# Patient Record
Sex: Female | Born: 1985 | Race: Black or African American | Hispanic: No | Marital: Single | Smoking: Current every day smoker
Health system: Southern US, Community
[De-identification: ages and names within clinical notes are randomized; demographics above are authoritative.]

## PROBLEM LIST (undated history)

## (undated) DIAGNOSIS — IMO0001 Reserved for inherently not codable concepts without codable children: Secondary | ICD-10-CM

## (undated) HISTORY — PX: NO PAST SURGERIES: SHX2092

---

## 2005-12-26 ENCOUNTER — Emergency Department (HOSPITAL_COMMUNITY): Admission: EM | Admit: 2005-12-26 | Discharge: 2005-12-27 | Payer: Self-pay | Admitting: Emergency Medicine

## 2014-05-21 ENCOUNTER — Other Ambulatory Visit (HOSPITAL_COMMUNITY)
Admission: RE | Admit: 2014-05-21 | Discharge: 2014-05-21 | Disposition: A | Discharge status: Home / Self Care | Payer: Self-pay | Source: Ambulatory Visit | Attending: Emergency Medicine | Admitting: Emergency Medicine

## 2014-05-21 ENCOUNTER — Encounter (HOSPITAL_COMMUNITY): Payer: Self-pay | Admitting: Emergency Medicine

## 2014-05-21 ENCOUNTER — Emergency Department (INDEPENDENT_AMBULATORY_CARE_PROVIDER_SITE_OTHER)
Admission: EM | Admit: 2014-05-21 | Discharge: 2014-05-21 | Disposition: A | Discharge status: Home / Self Care | Payer: Self-pay | Source: Home / Self Care | Attending: Emergency Medicine | Admitting: Emergency Medicine

## 2014-05-21 DIAGNOSIS — Z113 Encounter for screening for infections with a predominantly sexual mode of transmission: Secondary | ICD-10-CM | POA: Insufficient documentation

## 2014-05-21 DIAGNOSIS — N760 Acute vaginitis: Secondary | ICD-10-CM | POA: Insufficient documentation

## 2014-05-21 DIAGNOSIS — N939 Abnormal uterine and vaginal bleeding, unspecified: Secondary | ICD-10-CM

## 2014-05-21 DIAGNOSIS — K625 Hemorrhage of anus and rectum: Secondary | ICD-10-CM

## 2014-05-21 LAB — POCT PREGNANCY, URINE: Preg Test, Ur: NEGATIVE

## 2014-05-21 NOTE — ED Provider Notes (Signed)
Medical screening examination/treatment/procedure(s) were performed by non-physician practitioner and as supervising physician I was immediately available for consultation/collaboration.  Timmie Calix, M.D.  Layali Freund C Ronald Londo, MD 05/21/14 2121 

## 2014-05-21 NOTE — Discharge Instructions (Signed)
Follow up with a gynecologist about your vaginal bleeding and a gastroenterologist about your rectal bleeding as soon as you are able.    Rectal Bleeding  Rectal bleeding is when blood comes out of the opening of the butt (anus). Rectal bleeding may show up as bright red blood or really dark poop (stool). The poop may look dark red, maroon, or black. Rectal bleeding is often a sign that something is wrong. This needs to be checked by a doctor.  HOME CARE  Eat a diet high in fiber. This will help keep your poop soft.  Limit activity.  Drink enough fluids to keep your pee (urine) clear or pale yellow.  Take a warm bath to soothe any pain.  Follow up with your doctor as told. GET HELP RIGHT AWAY IF:  You have more bleeding.  You have black or dark red poop.  You throw up (vomit) blood or it looks like coffee grounds.  You have belly (abdominal) pain or tenderness.  You have a fever.  You feel weak, sick to your stomach (nauseous), or you pass out (faint).  You have pain that is so bad you cannot poop (bowel movement). MAKE SURE YOU:  Understand these instructions.  Will watch your condition.  Will get help right away if you are not doing well or get worse. Document Released: 03/21/2011 Document Revised: 11/23/2013 Document Reviewed: 03/21/2011 [facility] Patient Information 2015 [place], [place]. This information is not intended to replace advice given to you by your health care provider. Make sure you discuss any questions you have with your health care provider.  Abnormal Uterine Bleeding Abnormal uterine bleeding means bleeding from the vagina that is not your normal menstrual period. This can be:  Bleeding or spotting between periods.  Bleeding after sex (sexual intercourse).  Bleeding that is heavier or more than normal.  Periods that last longer than usual.  Bleeding after menopause. There are many problems that may cause this. Treatment will depend on the cause  of the bleeding. Any kind of bleeding that is not normal should be reviewed by your doctor.  HOME CARE Watch your condition for any changes. These actions may lessen any discomfort you are having:  Do not use tampons or douches as told by your doctor.  Change your pads often. You should get regular pelvic exams and Pap tests. Keep all appointments for tests as told by your doctor. GET HELP IF:  You are bleeding for more than 1 week.  You feel dizzy at times. GET HELP RIGHT AWAY IF:   You pass out.  You have to change pads every 15 to 30 minutes.  You have belly pain.  You have a fever.  You become sweaty or weak.  You are passing large blood clots from the vagina.  You feel sick to your stomach (nauseous) and throw up (vomit). MAKE SURE YOU:  Understand these instructions.  Will watch your condition.  Will get help right away if you are not doing well or get worse. Document Released: 05/06/2009 Document Revised: 07/14/2013 Document Reviewed: 02/05/2013 [facility] Patient Information 2015 [place], [place]. This information is not intended to replace advice given to you by your health care provider. Make sure you discuss any questions you have with your health care provider.

## 2014-05-21 NOTE — ED Provider Notes (Signed)
CSN: [id number]     Arrival date & time 05/21/14  1842 History   First MD Initiated Contact with Patient 05/21/14 2032     Chief Complaint  Patient presents with  . Fever   (Consider location/radiation/quality/duration/timing/severity/associated sxs/prior Treatment) HPI Comments: Pt reports using birth control patch for 6 years. Last used in September, and had period first week in October. Did not restart patch and has been spotting since. Had intercourse once in this time and it resulted in increased bleeding. Pt also reports rectal bleeding with bowel movements, once was a copious amount of red blood about a week ago.   Patient is a 28 y.o. female presenting with vaginal bleeding and hematochezia. The history is provided by the patient. No language interpreter was used.  Vaginal Bleeding Quality:  Spotting Severity:  Mild Onset quality:  Unable to specify Duration:  2 weeks Timing:  Intermittent Progression:  Unchanged Chronicity:  New Menstrual history:  Regular Possible pregnancy: no   Context: after intercourse and spontaneously   Relieved by:  None tried Worsened by:  Intercourse Ineffective treatments:  None tried Associated symptoms: vaginal discharge   Associated symptoms: no abdominal pain, no dyspareunia, no dysuria, no fever and no nausea   Risk factors: unprotected sex   Rectal Bleeding Quality:  Bright red Amount:  Moderate Timing:  Rare Progression:  Unchanged Relieved by:  None tried Worsened by:  Nothing tried Ineffective treatments:  None tried Associated symptoms: no abdominal pain, no fever and no vomiting     History reviewed. No pertinent past medical history. History reviewed. No pertinent past surgical history. Family History  Problem Relation Age of Onset  . Hypertension Father    History  Substance Use Topics  . Smoking status: Never Smoker   . Smokeless tobacco: Not on file  . Alcohol Use: No   OB History   Grav Para Term Preterm  Abortions TAB SAB Ect Mult Living                 Review of Systems  Constitutional: Negative for fever and chills.  Gastrointestinal: Positive for constipation and hematochezia. Negative for nausea, vomiting, abdominal pain and diarrhea.       Rectal bleeding  Genitourinary: Positive for vaginal bleeding and vaginal discharge. Negative for dysuria, hematuria, genital sores, vaginal pain, pelvic pain and dyspareunia.    Allergies  Review of patient's allergies indicates no known allergies.  Home Medications   Prior to Admission medications   Not on File   BP 122/84  Pulse 66  Temp(Src) 98.3 F (36.8 C) (Oral)  Resp 16  SpO2 100%  LMP 04/23/2014 Physical Exam  Constitutional: She appears well-developed and well-nourished. No distress.  Cardiovascular: Normal rate and regular rhythm.   Pulmonary/Chest: Effort normal and breath sounds normal.  Abdominal: Soft. Bowel sounds are normal. She exhibits no distension. There is no tenderness. There is no rigidity, no rebound, no guarding and no CVA tenderness.  Genitourinary: Uterus normal. There is no rash, tenderness or lesion on the right labia. There is no rash, tenderness or lesion on the left labia. Cervix exhibits friability. Cervix exhibits no motion tenderness and no discharge. Right adnexum displays no mass and no tenderness. Left adnexum displays no mass and no tenderness. Vaginal discharge found.  No active bleeding. Scant old blood in vagina.   Lymphadenopathy:       Right: No inguinal adenopathy present.       Left: No inguinal adenopathy present.    ED  Course  Procedures (including critical care time) Labs Review Labs Reviewed  POC OCCULT BLOOD, ED  POCT PREGNANCY, URINE  CERVICOVAGINAL ANCILLARY ONLY    Imaging Review No results found.   MDM   1. Vaginal bleeding   2. Rectal bleeding   Urine preg negative. Hemoccult positive. UA shows blood (likely contaminant from vagina) and trace leuk.   Referred to  [facility] for further eval.      Cathlyn ParsonsAngela M Ason Heslin, NP 05/21/14 2117

## 2014-05-21 NOTE — ED Notes (Signed)
C/o fever 99.9 last night.  C/o vaginal bleeding onset 2nd week of October and rectal bleeding around the same time.  Rectal bleeding only when she has a BM.  C/o cramping in her abdomen today and 2 days last week.  States she spots but when she has intercourse the bleeding is like a period.  Had vaginal discharge the 2nd week in Oct.  LMP 10/2 and ended 10/6.  Bleeding started back 10/12 and cont.

## 2014-05-24 LAB — POCT URINALYSIS DIP (DEVICE)
BILIRUBIN URINE: NEGATIVE
Glucose, UA: NEGATIVE mg/dL
Nitrite: NEGATIVE
PH: 6.5 (ref 5.0–8.0)
PROTEIN: NEGATIVE mg/dL
SPECIFIC GRAVITY, URINE: 1.02 (ref 1.005–1.030)
Urobilinogen, UA: 0.2 mg/dL (ref 0.0–1.0)

## 2014-05-24 LAB — OCCULT BLOOD, POC DEVICE: Fecal Occult Bld: POSITIVE — AB

## 2014-05-24 LAB — CERVICOVAGINAL ANCILLARY ONLY
Chlamydia: POSITIVE — AB
Neisseria Gonorrhea: NEGATIVE

## 2014-05-25 ENCOUNTER — Telehealth (HOSPITAL_COMMUNITY): Payer: Self-pay | Admitting: Emergency Medicine

## 2014-05-25 ENCOUNTER — Telehealth (HOSPITAL_COMMUNITY): Payer: Self-pay | Admitting: *Deleted

## 2014-05-25 LAB — CERVICOVAGINAL ANCILLARY ONLY
WET PREP (BD AFFIRM): NEGATIVE
Wet Prep (BD Affirm): NEGATIVE
Wet Prep (BD Affirm): NEGATIVE

## 2014-05-25 MED ORDER — AZITHROMYCIN 500 MG PO TABS: 1000.0000 mg | ORAL_TABLET | Freq: Every day | ORAL | Status: DC

## 2014-05-25 NOTE — ED Notes (Signed)
DNA probe was positive for chlamydia.  Will treat with azithromycin 1 gm PO, and need to inform patient and HD. She does not have a pharmacy listed, so we will need to call her, find out her pharmacy, then call the prescription in to her pharmacy.  Reuben Likesavid C Delmo Matty, MD 05/25/14 [phone]

## 2014-05-25 NOTE — Telephone Encounter (Signed)
-----   Message from Vassie MoselleSuzanne M York, RN sent at 05/24/2014  9:25 PM EST ----- Regarding: lab Chlamydia pos. Pt. of Rica MastAngela Kabbe NP and you co-signed. Needs treatment. Vassie MoselleYork, Suzanne M 05/24/2014

## 2014-05-26 NOTE — ED Notes (Signed)
11/3 I called pt. Pt. verified x 2 and given results.  Pt. told she needs Zithromax for Chlamydia.  [facility] [address]  Pt. instructed to notify her partner, no sex for 1 week and to practice safe sex. Pt. told she can get HIV testing at [facility], by appointment.  Rx. called to pharmacist. DHHS [facility]. Vassie MoselleYork, Gennavieve Huq M 05/26/2014

## 2014-08-15 ENCOUNTER — Emergency Department (HOSPITAL_COMMUNITY)
Admission: EM | Admit: 2014-08-15 | Discharge: 2014-08-15 | Disposition: A | Discharge status: Home / Self Care | Payer: Self-pay | Attending: Emergency Medicine | Admitting: Emergency Medicine

## 2014-08-15 ENCOUNTER — Encounter (HOSPITAL_COMMUNITY): Payer: Self-pay | Admitting: *Deleted

## 2014-08-15 ENCOUNTER — Emergency Department (HOSPITAL_COMMUNITY): Payer: Self-pay

## 2014-08-15 DIAGNOSIS — S61452A Open bite of left hand, initial encounter: Secondary | ICD-10-CM | POA: Insufficient documentation

## 2014-08-15 DIAGNOSIS — Y9289 Other specified places as the place of occurrence of the external cause: Secondary | ICD-10-CM | POA: Insufficient documentation

## 2014-08-15 DIAGNOSIS — S0012XA Contusion of left eyelid and periocular area, initial encounter: Secondary | ICD-10-CM | POA: Insufficient documentation

## 2014-08-15 DIAGNOSIS — Y998 Other external cause status: Secondary | ICD-10-CM | POA: Insufficient documentation

## 2014-08-15 DIAGNOSIS — W503XXA Accidental bite by another person, initial encounter: Secondary | ICD-10-CM

## 2014-08-15 DIAGNOSIS — Z792 Long term (current) use of antibiotics: Secondary | ICD-10-CM | POA: Insufficient documentation

## 2014-08-15 DIAGNOSIS — Y9389 Activity, other specified: Secondary | ICD-10-CM | POA: Insufficient documentation

## 2014-08-15 DIAGNOSIS — S61451A Open bite of right hand, initial encounter: Secondary | ICD-10-CM | POA: Insufficient documentation

## 2014-08-15 MED ORDER — HYDROCODONE-ACETAMINOPHEN 5-325 MG PO TABS: 1.0000 | ORAL_TABLET | ORAL | Status: DC | PRN

## 2014-08-15 MED ORDER — AMOXICILLIN-POT CLAVULANATE 875-125 MG PO TABS
1.0000 | ORAL_TABLET | Freq: Once | ORAL | Status: AC
  Administered 2014-08-15: 1 via ORAL
  Filled 2014-08-15: qty 1

## 2014-08-15 MED ORDER — AMOXICILLIN-POT CLAVULANATE 875-125 MG PO TABS: 1.0000 | ORAL_TABLET | Freq: Two times a day (BID) | ORAL | Status: DC
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
## 2014-08-15 NOTE — ED Notes (Signed)
Pt left fast trac before vitals were done. Pt was seen by Child psychotherapistocial worker .

## 2014-08-15 NOTE — ED Notes (Signed)
Declined W/C at D/C and was escorted to lobby by RN. 
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
## 2014-09-24 ENCOUNTER — Other Ambulatory Visit (HOSPITAL_COMMUNITY)
Admission: RE | Admit: 2014-09-24 | Discharge: 2014-09-24 | Disposition: A | Discharge status: Home / Self Care | Payer: Self-pay | Source: Ambulatory Visit | Attending: Obstetrics and Gynecology | Admitting: Obstetrics and Gynecology

## 2014-09-24 ENCOUNTER — Other Ambulatory Visit: Payer: Self-pay | Admitting: Obstetrics and Gynecology

## 2014-09-24 DIAGNOSIS — Z01419 Encounter for gynecological examination (general) (routine) without abnormal findings: Secondary | ICD-10-CM | POA: Insufficient documentation

## 2014-09-27 LAB — CYTOLOGY - PAP

## 2015-01-05 ENCOUNTER — Ambulatory Visit (INDEPENDENT_AMBULATORY_CARE_PROVIDER_SITE_OTHER): Payer: 59 | Admitting: Physician Assistant

## 2015-01-05 VITALS — BP 102/70 | HR 61 | Temp 98.3°F | Resp 18 | Ht 62.0 in | Wt 116.5 lb

## 2015-01-05 DIAGNOSIS — A499 Bacterial infection, unspecified: Secondary | ICD-10-CM

## 2015-01-05 DIAGNOSIS — Z113 Encounter for screening for infections with a predominantly sexual mode of transmission: Secondary | ICD-10-CM

## 2015-01-05 DIAGNOSIS — L298 Other pruritus: Secondary | ICD-10-CM

## 2015-01-05 DIAGNOSIS — N760 Acute vaginitis: Secondary | ICD-10-CM | POA: Diagnosis not present

## 2015-01-05 DIAGNOSIS — B9689 Other specified bacterial agents as the cause of diseases classified elsewhere: Secondary | ICD-10-CM

## 2015-01-05 DIAGNOSIS — N898 Other specified noninflammatory disorders of vagina: Secondary | ICD-10-CM

## 2015-01-05 LAB — POCT UA - MICROSCOPIC ONLY
CASTS, UR, LPF, POC: NEGATIVE
Crystals, Ur, HPF, POC: NEGATIVE
Yeast, UA: NEGATIVE

## 2015-01-05 LAB — POCT URINALYSIS DIPSTICK
Bilirubin, UA: NEGATIVE
Glucose, UA: NEGATIVE
NITRITE UA: NEGATIVE
PH UA: 5.5
RBC UA: NEGATIVE
Spec Grav, UA: >=1.03
UROBILINOGEN UA: 0.2

## 2015-01-05 LAB — POCT WET PREP WITH KOH
KOH Prep POC: NEGATIVE
TRICHOMONAS UA: NEGATIVE
YEAST WET PREP PER HPF POC: NEGATIVE

## 2015-01-05 MED ORDER — METRONIDAZOLE 500 MG PO TABS: 500.0000 mg | ORAL_TABLET | Freq: Two times a day (BID) | ORAL | Status: DC

## 2015-01-05 NOTE — Patient Instructions (Addendum)
Please take to completion.  I will be in touch with you within the next 3 days, of your lab results, and we will go from there.    Bacterial Vaginosis Bacterial vaginosis is an infection of the vagina. It happens when too many of certain germs (bacteria) grow in the vagina. HOME CARE  Take your medicine as told by your doctor.  Finish your medicine even if you start to feel better.  Do not have sex until you finish your medicine and are better.  Tell your sex partner that you have an infection. They should see their doctor for treatment.  Practice safe sex. Use condoms. Have only one sex partner. GET HELP IF:  You are not getting better after 3 days of treatment.  You have more grey fluid (discharge) coming from your vagina than before.  You have more pain than before.  You have a fever. MAKE SURE YOU:   Understand these instructions.  Will watch your condition.  Will get help right away if you are not doing well or get worse. Document Released: 04/17/2008 Document Revised: 04/29/2013 Document Reviewed: 02/18/2013 [facility] Patient Information 2015 [place], [place]. This information is not intended to replace advice given to you by your health care provider. Make sure you discuss any questions you have with your health care provider.

## 2015-01-05 NOTE — Progress Notes (Signed)
[facility] [address] [place] [address] [phone]  Date:  01/05/2015   Name:  Yolanda Morrison   DOB:  04-16-1986   MRN:  [id number]  PCP:  No PCP Per Patient    History of Present Illness:  Yolanda Morrison is a 29 y.o. female patient who presents to [facility] with chief complaint of vaginal itching for several days.  She has no abnormal vaginal discharge, odor, or rash. Patient denies dysuria, frequency, or hematuria. She has no abdominal pain. Patient is concerned because the person that she was sexually active with seemed "sketchy", though there is no history of STD.  Patient is requesting STD screening, but she would not like an HIV test today, stating that she is "not ready for that".  There are no active problems to display for this patient.   No past medical history on file.  No past surgical history on file.  History  Substance Use Topics  . Smoking status: Never Smoker   . Smokeless tobacco: Not on file  . Alcohol Use: No    Family History  Problem Relation Age of Onset  . Hypertension Father   . Hyperlipidemia Father   . Cancer Maternal Grandmother     No Known Allergies  Medication list has been reviewed and updated.  No current outpatient prescriptions on file prior to visit.   No current facility-administered medications on file prior to visit.    ROS ROS otherwise unremarkable unless listed above.  Physical Examination: BP 102/70 mmHg  Pulse 61  Temp(Src) 98.3 F (36.8 C) (Oral)  Resp 18  Ht 5\' 2"  (1.575 m)  Wt 116 lb 8 oz (52.844 kg)  BMI 21.30 kg/m2  SpO2 98%  LMP 12/22/2014 (Approximate) Ideal Body Weight: Weight in (lb) to have BMI = 25: 136.4  Physical Exam  Constitutional: She is oriented to person, place, and time. She appears well-developed and well-nourished. No distress.  HENT:  Head: Normocephalic and atraumatic.  Cardiovascular: Normal rate.   Pulmonary/Chest: Effort normal. No respiratory distress.   Abdominal: Soft. Bowel sounds are normal. She exhibits no distension. There is no tenderness.  Genitourinary: Pelvic exam was performed with patient supine. There is no rash or tenderness on the right labia. There is no rash or tenderness on the left labia. Cervix exhibits discharge (Whitish yellow.  Mildly erythematous patching along the cervix without friability.  There is some bubbling within the discharge, though not frothy.). Cervix exhibits no motion tenderness and no friability. Vaginal discharge (whitish yellow) found.  Neurological: She is alert and oriented to person, place, and time.  Skin: Skin is warm and dry.   Results for orders placed or performed in visit on 01/05/15  POCT UA - Microscopic Only  Result Value Ref Range   WBC, Ur, HPF, POC 15-20    RBC, urine, microscopic 1-3    Bacteria, U Microscopic 2+    Mucus, UA 1+    Epithelial cells, urine per micros 8-12    Crystals, Ur, HPF, POC neg    Casts, Ur, LPF, POC neg    Yeast, UA neg   POCT urinalysis dipstick  Result Value Ref Range   Color, UA amber    Clarity, UA cloudy    Glucose, UA neg    Bilirubin, UA neg    Ketones, UA trace    Spec Grav, UA >=1.030    Blood, UA neg    pH, UA 5.5    Protein, UA trace  Urobilinogen, UA 0.2    Nitrite, UA neg    Leukocytes, UA small (1+) (A) Negative  POCT Wet Prep with KOH  Result Value Ref Range   Trichomonas, UA Negative    Clue Cells Wet Prep HPF POC 2-3    Epithelial Wet Prep HPF POC Few Few, Moderate, Many   Yeast Wet Prep HPF POC neg    Bacteria Wet Prep HPF POC Moderate (A) Few   RBC Wet Prep HPF POC 0-2    WBC Wet Prep HPF POC 8-12    KOH Prep POC Negative      Assessment and Plan: 29 year old female is here today with chief complaint of vaginal itching. I am treating her for arterial vaginosis at this time with metronidazole twice a day for 7 days. I have advised her to await contact. I am concerned of the bacteria leukocyte found on UA and wet prep,  but without more symptoms or direct STD contact, I will hold off on providing empiric treatment at this time. I have advised her to avoid sexual contact at this time pending results.  I advised her to have an HIV test, but she declines at this time.  Bacterial vaginosis  Vaginal itching - Plan: POCT UA - Microscopic Only, POCT urinalysis dipstick, RPR, GC/Chlamydia Probe Amp, POCT Wet Prep with KOH, metroNIDAZOLE (FLAGYL) 500 MG tablet  Screening for STD (sexually transmitted disease)  Trena Platt, PA-C [facility] 6/15/20169:10 PM

## 2015-01-06 LAB — RPR

## 2015-01-07 LAB — GC/CHLAMYDIA PROBE AMP
CT Probe RNA: NEGATIVE
GC Probe RNA: NEGATIVE

## 2015-02-12 ENCOUNTER — Encounter (HOSPITAL_BASED_OUTPATIENT_CLINIC_OR_DEPARTMENT_OTHER): Payer: Self-pay | Admitting: Emergency Medicine

## 2015-02-12 ENCOUNTER — Emergency Department (HOSPITAL_BASED_OUTPATIENT_CLINIC_OR_DEPARTMENT_OTHER)
Admission: EM | Admit: 2015-02-12 | Discharge: 2015-02-12 | Disposition: A | Discharge status: Home / Self Care | Payer: Worker's Compensation | Attending: Emergency Medicine | Admitting: Emergency Medicine

## 2015-02-12 ENCOUNTER — Emergency Department (HOSPITAL_BASED_OUTPATIENT_CLINIC_OR_DEPARTMENT_OTHER): Payer: Worker's Compensation

## 2015-02-12 DIAGNOSIS — Y990 Civilian activity done for income or pay: Secondary | ICD-10-CM | POA: Insufficient documentation

## 2015-02-12 DIAGNOSIS — Y9389 Activity, other specified: Secondary | ICD-10-CM | POA: Insufficient documentation

## 2015-02-12 DIAGNOSIS — IMO0001 Reserved for inherently not codable concepts without codable children: Secondary | ICD-10-CM

## 2015-02-12 DIAGNOSIS — S68129A Partial traumatic metacarpophalangeal amputation of unspecified finger, initial encounter: Secondary | ICD-10-CM

## 2015-02-12 DIAGNOSIS — Y9289 Other specified places as the place of occurrence of the external cause: Secondary | ICD-10-CM | POA: Insufficient documentation

## 2015-02-12 DIAGNOSIS — W230XXA Caught, crushed, jammed, or pinched between moving objects, initial encounter: Secondary | ICD-10-CM | POA: Insufficient documentation

## 2015-02-12 DIAGNOSIS — S62633A Displaced fracture of distal phalanx of left middle finger, initial encounter for closed fracture: Secondary | ICD-10-CM | POA: Insufficient documentation

## 2015-02-12 MED ORDER — HYDROCODONE-ACETAMINOPHEN 5-325 MG PO TABS: 2.0000 | ORAL_TABLET | ORAL | Status: DC | PRN

## 2015-02-12 MED ORDER — HYDROCODONE-ACETAMINOPHEN 5-325 MG PO TABS
2.0000 | ORAL_TABLET | Freq: Once | ORAL | Status: AC
  Administered 2015-02-12: 2 via ORAL
  Filled 2015-02-12: qty 2

## 2015-02-12 MED ORDER — BACITRACIN 500 UNIT/GM EX OINT
1.0000 "application " | TOPICAL_OINTMENT | Freq: Two times a day (BID) | CUTANEOUS | Status: DC
  Administered 2015-02-12: 1 via TOPICAL
  Filled 2015-02-12: qty 0.9

## 2015-02-12 MED ORDER — NAPROXEN 500 MG PO TABS: 500.0000 mg | ORAL_TABLET | Freq: Two times a day (BID) | ORAL | Status: DC

## 2015-02-12 MED ORDER — LIDOCAINE HCL (PF) 1 % IJ SOLN
INTRAMUSCULAR | Status: AC
  Administered 2015-02-12: 17:00:00
  Filled 2015-02-12: qty 5

## 2015-02-12 MED ORDER — LIDOCAINE HCL (PF) 1 % IJ SOLN
10.0000 mL | Freq: Once | INTRAMUSCULAR | Status: AC
  Administered 2015-02-12: 10 mL
  Filled 2015-02-12: qty 10

## 2015-02-12 MED ORDER — CEPHALEXIN 250 MG PO CAPS
500.0000 mg | ORAL_CAPSULE | Freq: Once | ORAL | Status: AC
  Administered 2015-02-12: 500 mg via ORAL
  Filled 2015-02-12: qty 2

## 2015-02-12 MED ORDER — CEPHALEXIN 500 MG PO CAPS: 500.0000 mg | ORAL_CAPSULE | Freq: Two times a day (BID) | ORAL | Status: DC

## 2015-02-12 NOTE — ED Notes (Signed)
Moved patient after providing discharge papers and dressing wound while she is waiting to speak with the MD

## 2015-02-12 NOTE — Discharge Instructions (Signed)

## 2015-02-12 NOTE — ED Notes (Signed)
Pt snagged 3rd left finger tip on a metal pole at work, tearing skin and exposing underlying tissue of finger pad. Yolanda Morrison

## 2015-02-12 NOTE — ED Provider Notes (Addendum)
CSN: [id number]     Arrival date & time 02/12/15  1603 History   This chart was scribed for Eber Hong, MD by Abel Presto, ED Scribe. This patient was seen in room MH05/MH05 and the patient's care was started at 4:12 PM.   Chief Complaint  Patient presents with  . Finger Injury     The history is provided by the patient. No language interpreter was used.   HPI Comments: Yolanda Morrison is a 29 y.o. female who presents to the Emergency Department complaining of laceration to distal pad of left middle finger with onset just PTA.  Pt was at work using a machine which had a pole that became caught on a conveyer belt pulling her finger and tearing the skin. Bleeding is controlled. Pt notes associated pain. Pt is not on blood thinners. She denies any other injury and numbness.    History reviewed. No pertinent past medical history. History reviewed. No pertinent past surgical history. Family History  Problem Relation Age of Onset  . Hypertension Father   . Hyperlipidemia Father   . Cancer Maternal Grandmother    History  Substance Use Topics  . Smoking status: Never Smoker   . Smokeless tobacco: Not on file  . Alcohol Use: No   OB History    No data available     Review of Systems  Skin: Positive for wound.  Neurological: Negative for numbness.      Allergies  Review of patient's allergies indicates no known allergies.  Home Medications   Prior to Admission medications   Medication Sig Start Date End Date Taking? Authorizing Provider  cephALEXin (KEFLEX) 500 MG capsule Take 1 capsule (500 mg total) by mouth 2 (two) times daily. 02/12/15   Eber Hong, MD  HYDROcodone-acetaminophen (NORCO/VICODIN) 5-325 MG per tablet Take 2 tablets by mouth every 4 (four) hours as needed. 02/12/15   Eber Hong, MD  metroNIDAZOLE (FLAGYL) 500 MG tablet Take 1 tablet (500 mg total) by mouth 2 (two) times daily with a meal. DO NOT CONSUME ALCOHOL WHILE TAKING THIS MEDICATION. 01/05/15    Collie Siad English, PA  naproxen (NAPROSYN) 500 MG tablet Take 1 tablet (500 mg total) by mouth 2 (two) times daily with a meal. 02/12/15   Eber Hong, MD   LMP 01/21/2015 (Approximate) Physical Exam  Constitutional: She appears well-developed and well-nourished.  HENT:  Head: Normocephalic and atraumatic.  Eyes: Conjunctivae are normal. Right eye exhibits no discharge. Left eye exhibits no discharge.  Pulmonary/Chest: Effort normal. No respiratory distress.  Musculoskeletal:  Left middle finger distal pad injury  Nail bed intact  Neurological: She is alert. Coordination normal.  Skin: Skin is warm and dry. No rash noted. She is not diaphoretic. No erythema.  Psychiatric: She has a normal mood and affect.  Nursing note and vitals reviewed.   ED Course  NERVE BLOCK Date/Time: 02/12/2015 5:00 PM Performed by: Eber Hong Authorized by: Eber Hong Consent: Verbal consent obtained. Risks and benefits: risks, benefits and alternatives were discussed Consent given by: patient Patient understanding: patient states understanding of the procedure being performed Required items: required blood products, implants, devices, and special equipment available Patient identity confirmed: verbally with patient Indications: debridement Body area: upper extremity Nerve: digital Laterality: left Patient sedated: no Preparation: Patient was prepped and draped in the usual sterile fashion. Patient position: sitting Needle gauge: 27 G Location technique: anatomical landmarks Local anesthetic: lidocaine 1% without epinephrine Anesthetic total: 4 ml Outcome: pain improved Patient tolerance: Patient tolerated  the procedure well with no immediate complications   (including critical care time) DIAGNOSTIC STUDIES: Oxygen Saturation is 100% on Room air, Normal by my interpretation.    COORDINATION OF CARE: 4:17 PM Discussed treatment plan with patient at beside, the patient agrees with the  plan and has no further questions at this time.   Labs Review Labs Reviewed - No data to display  Imaging Review Dg Finger Middle Left  02/12/2015   CLINICAL DATA:  Initial encounter: Patient injured after hitting metal pole  EXAM: LEFT THIRD FINGER 2+V  COMPARISON:  None.  FINDINGS: Frontal, oblique, and lateral views were obtained. There is extensive soft tissue injury to the distal aspect of the third digit. There is a comminuted fracture of the distal aspect of the third distal phalanx with several displaced fracture fragments. No other fractures. No dislocation. Joint spaces appear intact.  IMPRESSION: Comminuted fracture distal aspect third distal phalanx with soft tissue injury distally. No dislocation.   Electronically Signed   By: Bretta Bang III M.D.   On: 02/12/2015 16:35      MDM   Final diagnoses:  Finger near amputation, left, initial encounter    The patient has significant tissue destruction of the left middle finger at the tip. I have personally reviewed the x-rays and find her to be a comminuted distal tuft fracture, there is significant soft tissue deficit overlying this. Digital block was performed and the patient was informed of her results. We'll discuss with the hand surgeon regarding follow-up care though at this time there does not appear to be in adequate piece of tissue to close over this injury.  Hand surgery paged  I personally performed the services described in this documentation, which was scribed in my presence. The recorded information has been reviewed and is accurate.          Discussed care with hand surgeon, Dr. Izora Ribas, he will see the patient in follow-up on Monday, sterile dressing given. Antibiotics given.   Meds given in ED:  Medications  bacitracin ointment 1 application (not administered)  cephALEXin (KEFLEX) capsule 500 mg (not administered)  HYDROcodone-acetaminophen (NORCO/VICODIN) 5-325 MG per tablet 2 tablet (not  administered)  lidocaine (PF) (XYLOCAINE) 1 % injection 10 mL (10 mLs Infiltration Given 02/12/15 1637)  lidocaine (PF) (XYLOCAINE) 1 % injection (  Given 02/12/15 1637)    New Prescriptions   CEPHALEXIN (KEFLEX) 500 MG CAPSULE    Take 1 capsule (500 mg total) by mouth 2 (two) times daily.   HYDROCODONE-ACETAMINOPHEN (NORCO/VICODIN) 5-325 MG PER TABLET    Take 2 tablets by mouth every 4 (four) hours as needed.   NAPROXEN (NAPROSYN) 500 MG TABLET    Take 1 tablet (500 mg total) by mouth 2 (two) times daily with a meal.      Eber Hong, MD 02/12/15 1757  Eber Hong, MD 02/24/15 [phone]

## 2015-03-08 ENCOUNTER — Ambulatory Visit (INDEPENDENT_AMBULATORY_CARE_PROVIDER_SITE_OTHER): Payer: 59 | Admitting: Internal Medicine

## 2015-03-08 VITALS — BP 110/86 | HR 63 | Temp 98.2°F | Resp 16 | Ht 62.5 in | Wt 121.8 lb

## 2015-03-08 DIAGNOSIS — N760 Acute vaginitis: Secondary | ICD-10-CM

## 2015-03-08 DIAGNOSIS — H6122 Impacted cerumen, left ear: Secondary | ICD-10-CM | POA: Diagnosis not present

## 2015-03-08 LAB — POCT WET PREP WITH KOH
KOH Prep POC: POSITIVE
Trichomonas, UA: NEGATIVE
Yeast Wet Prep HPF POC: POSITIVE

## 2015-03-08 MED ORDER — FLUCONAZOLE 150 MG PO TABS: 150.0000 mg | ORAL_TABLET | Freq: Once | ORAL | Status: DC

## 2015-03-08 NOTE — Patient Instructions (Signed)
Ear irrigation kit at pharmacy(will have a hydrogen peroxide solution--or cerumenex) Look for "Debrox" Return 1 week if not completely clear 

## 2015-03-08 NOTE — Progress Notes (Signed)
   Subjective:  This chart was scribed for [name], MD by [facility], medical scribe at [facility].The patient was seen in exam room ** and the patient's care was started at 4:17 PM.   Patient ID: Yolanda Morrison, female    DOB: [birth year], 29 y.o.   MRN: [id number] Chief Complaint  Patient presents with  . Cerumen Impaction    x2 weeks   . Vaginal Discharge    x1 week    HPI HPI Comments: Yolanda Morrison is a 29 y.o. female who presents to [facility] complaining of a clumpy, thick vaginal discharge with associated tenderness and itching for the past week. Seen here on 01/05/2015 for a similar issue, and treated with flagyl which did provide relief.  No new sexual partners.   She also complains of bilateral cerumen impaction left worse than right for the past two weeks. No fever or chills. decr hearing on L.  Review of Systems  Constitutional: Negative for fever and chills.  Genitourinary: Positive for vaginal discharge and vaginal pain.      Objective:  BP 110/86 mmHg  Pulse 63  Temp(Src) 98.2 F (36.8 C) (Oral)  Resp 16  Ht 5' 2.5" (1.588 m)  Wt 121 lb 12.8 oz (55.248 kg)  BMI 21.91 kg/m2  SpO2 97%  LMP 02/21/2015 Physical Exam  Constitutional: She is oriented to person, place, and time. She appears well-developed and well-nourished. No distress.  HENT:  Head: Normocephalic and atraumatic.  Minimal wax on right Left auditory canal is completely occluded.  Eyes: Pupils are equal, round, and reactive to light.  Neck: Normal range of motion.  Cardiovascular: Normal rate.   Pulmonary/Chest: Effort normal. No respiratory distress.  Genitourinary:  Introitus w/ red sl swollen labia with thick white d/c No ulcers  KOH positive  Musculoskeletal: Normal range of motion.  Neurological: She is alert and oriented to person, place, and time.  Skin: Skin is warm and dry.  Psychiatric: She has a normal mood and affect. Her  behavior is normal.  Nursing note and vitals reviewed.    procedure: The left anterior is irrigated---the L ear that is---twice after colace without success So curetted til Tm exposed and re irrigated with longer line and most wax removed Assessment & Plan:  Vaginitis and vulvovaginitis - Plan: POCT Wet Prep with KOH  Cerumen impaction, left  Meds ordered this encounter  Medications  . fluconazole (DIFLUCAN) 150 MG tablet    Sig: Take 1 tablet (150 mg total) by mouth once.    Dispense:  1 tablet    Refill:  2   F/u if vag d/c recurs F/u if wax reimpacts   I have completed the patient encounter in its entirety as documented by the scribe, with editing by me where necessary. Robert P. Merla Riches, M.D.

## 2015-04-15 ENCOUNTER — Telehealth: Payer: Self-pay

## 2015-04-15 DIAGNOSIS — N760 Acute vaginitis: Secondary | ICD-10-CM

## 2015-04-15 NOTE — Telephone Encounter (Signed)
Patient would like a referral to [facility]. Patient phone! [phone]

## 2015-04-15 NOTE — Telephone Encounter (Signed)
Spoke with pt, advised referral placed. 

## 2015-04-15 NOTE — Telephone Encounter (Signed)
Referral placed.

## 2015-04-15 NOTE — Telephone Encounter (Signed)
Can we refer?  She has discomfort in her vaginal area but has no symptoms. Her clitoris is very sensitive to touch. She doesn't have any itching, burning, or discharge.

## 2015-04-16 ENCOUNTER — Ambulatory Visit (INDEPENDENT_AMBULATORY_CARE_PROVIDER_SITE_OTHER): Payer: 59 | Admitting: Physician Assistant

## 2015-04-16 VITALS — BP 112/78 | HR 71 | Temp 98.2°F | Resp 16 | Ht 62.5 in | Wt 123.0 lb

## 2015-04-16 DIAGNOSIS — N898 Other specified noninflammatory disorders of vagina: Secondary | ICD-10-CM | POA: Diagnosis not present

## 2015-04-16 DIAGNOSIS — B379 Candidiasis, unspecified: Secondary | ICD-10-CM | POA: Diagnosis not present

## 2015-04-16 LAB — POCT WET + KOH PREP
Trich by wet prep: ABSENT
YEAST BY KOH: ABSENT
YEAST BY WET PREP: ABSENT

## 2015-04-16 MED ORDER — NYSTATIN 100000 UNIT/GM EX CREA: 1.0000 "application " | TOPICAL_CREAM | Freq: Two times a day (BID) | CUTANEOUS | Status: AC

## 2015-04-16 MED ORDER — FLUCONAZOLE 150 MG PO TABS: 150.0000 mg | ORAL_TABLET | Freq: Once | ORAL | Status: DC

## 2015-04-16 NOTE — Patient Instructions (Signed)
Please take the medication once.  Repeat take the second pill after 1 week, if you are still having symptoms.  I will contact you in regards to the results. Keep that area clean twice per day.  Apply the cream as prescribed.    Monilial Vaginitis Vaginitis in a soreness, swelling and redness (inflammation) of the vagina and vulva. Monilial vaginitis is not a sexually transmitted infection. CAUSES  Yeast vaginitis is caused by yeast (candida) that is normally found in your vagina. With a yeast infection, the candida has overgrown in number to a point that upsets the chemical balance. SYMPTOMS   White, thick vaginal discharge.  Swelling, itching, redness and irritation of the vagina and possibly the lips of the vagina (vulva).  Burning or painful urination.  Painful intercourse. DIAGNOSIS  Things that may contribute to monilial vaginitis are:  Postmenopausal and virginal states.  Pregnancy.  Infections.  Being tired, sick or stressed, especially if you had monilial vaginitis in the past.  Diabetes. Good control will help lower the chance.  Birth control pills.  Tight fitting garments.  Using bubble bath, feminine sprays, douches or deodorant tampons.  Taking certain medications that kill germs (antibiotics).  Sporadic recurrence can occur if you become ill. TREATMENT  Your caregiver will give you medication.  There are several kinds of anti monilial vaginal creams and suppositories specific for monilial vaginitis. For recurrent yeast infections, use a suppository or cream in the vagina 2 times a week, or as directed.  Anti-monilial or steroid cream for the itching or irritation of the vulva may also be used. Get your caregiver's permission.  Painting the vagina with methylene blue solution may help if the monilial cream does not work.  Eating yogurt may help prevent monilial vaginitis. HOME CARE INSTRUCTIONS   Finish all medication as prescribed.  Do not have sex  until treatment is completed or after your caregiver tells you it is okay.  Take warm sitz baths.  Do not douche.  Do not use tampons, especially scented ones.  Wear cotton underwear.  Avoid tight pants and panty hose.  Tell your sexual partner that you have a yeast infection. They should go to their caregiver if they have symptoms such as mild rash or itching.  Your sexual partner should be treated as well if your infection is difficult to eliminate.  Practice safer sex. Use condoms.  Some vaginal medications cause latex condoms to fail. Vaginal medications that harm condoms are:  Cleocin cream.  Butoconazole (Femstat).  Terconazole (Terazol) vaginal suppository.  Miconazole (Monistat) (may be purchased over the counter). SEEK MEDICAL CARE IF:   You have a temperature by mouth above 102 F (38.9 C).  The infection is getting worse after 2 days of treatment.  The infection is not getting better after 3 days of treatment.  You develop blisters in or around your vagina.  You develop vaginal bleeding, and it is not your menstrual period.  You have pain when you urinate.  You develop intestinal problems.  You have pain with sexual intercourse. Document Released: 04/18/2005 Document Revised: 10/01/2011 Document Reviewed: 12/31/2008 [facility] Patient Information 2015 [place], [place]. This information is not intended to replace advice given to you by your health care provider. Make sure you discuss any questions you have with your health care provider.

## 2015-04-19 LAB — GC/CHLAMYDIA PROBE AMP
CT Probe RNA: NEGATIVE
GC PROBE AMP APTIMA: NEGATIVE

## 2015-04-22 NOTE — Progress Notes (Addendum)
   Subjective:    Patient ID: Yolanda Morrison, female    DOB: Apr 15, 1986, 29 y.o.   MRN: [id number]  HPI  29 year old female is here today for chief complaint of abnormal vaginal pruritus for 2 weeks.    Patient states her discharge is normal and without rash and odor.  No nausea or vomiting.  No frequency, dysuria, or hematuria.   Sexually active unprotected at this time.   Review of Systems ROS otherwise unremarkable unless listed above.     Objective:   Physical Exam  Constitutional: She is oriented to person, place, and time. She appears well-developed and well-nourished. No distress.  HENT:  Head: Normocephalic.  Cardiovascular: Normal rate and regular rhythm.   Pulmonary/Chest: Effort normal and breath sounds normal. No respiratory distress. She has no wheezes.  Genitourinary: There is no rash or tenderness on the right labia. There is no rash or tenderness on the left labia. Vaginal discharge found.  Discharge surrounds the clitoris and labia.   Neurological: She is alert and oriented to person, place, and time.  Skin: Skin is warm and dry. She is not diaphoretic.  Psychiatric: She has a normal mood and affect. Her behavior is normal.   Results for orders placed or performed in visit on 04/16/15  GC/Chlamydia Probe Amp  Result Value Ref Range   CT Probe RNA NEGATIVE    GC Probe RNA NEGATIVE   POCT Wet + KOH Prep ([facility])  Result Value Ref Range   Yeast by KOH Absent Present, Absent   Yeast by wet prep Absent Present, Absent   WBC by wet prep Many (A) None, Few   Clue Cells Wet Prep HPF POC Few (A) None   Trich by wet prep Absent Present, Absent   Bacteria Wet Prep HPF POC Few None, Few   Epithelial Cells By [employer] Pref ([facility]) Moderate (A) None, Few   RBC,UR,HPF,POC Few (A) None RBC/hpf       Assessment & Plan:  29 year old female is here today for chief complaint abnormal vaginal discharge.  -Advised diflucan and nystatin topical cream.   Yeast infection - Plan:  fluconazole (DIFLUCAN) 150 MG tablet, nystatin cream (MYCOSTATIN)  Vaginal discharge - Plan: POCT Wet + KOH Prep ([facility]), GC/Chlamydia Probe Amp  Trena Platt, PA-C [facility] 9/30/20163:34 PM

## 2015-05-03 NOTE — Progress Notes (Signed)
  Medical screening examination/treatment/procedure(s) were performed by non-physician practitioner and as supervising physician I was immediately available for consultation/collaboration.     

## 2015-07-03 ENCOUNTER — Ambulatory Visit (INDEPENDENT_AMBULATORY_CARE_PROVIDER_SITE_OTHER): Payer: 59 | Admitting: Family Medicine

## 2015-07-03 VITALS — BP 108/70 | HR 76 | Temp 98.2°F | Resp 18 | Ht 62.5 in | Wt 126.2 lb

## 2015-07-03 DIAGNOSIS — N898 Other specified noninflammatory disorders of vagina: Secondary | ICD-10-CM

## 2015-07-03 DIAGNOSIS — B3731 Acute candidiasis of vulva and vagina: Secondary | ICD-10-CM

## 2015-07-03 DIAGNOSIS — B379 Candidiasis, unspecified: Secondary | ICD-10-CM

## 2015-07-03 DIAGNOSIS — B373 Candidiasis of vulva and vagina: Secondary | ICD-10-CM

## 2015-07-03 LAB — POC MICROSCOPIC URINALYSIS (UMFC)

## 2015-07-03 LAB — POCT URINALYSIS DIP (MANUAL ENTRY)
BILIRUBIN UA: NEGATIVE
BILIRUBIN UA: NEGATIVE
Glucose, UA: NEGATIVE
Nitrite, UA: NEGATIVE
SPEC GRAV UA: 1.025
Urobilinogen, UA: 0.2
pH, UA: 5.5

## 2015-07-03 LAB — POCT WET + KOH PREP: Trich by wet prep: ABSENT

## 2015-07-03 MED ORDER — FLUCONAZOLE 150 MG PO TABS: 150.0000 mg | ORAL_TABLET | Freq: Once | ORAL | Status: DC

## 2015-07-03 NOTE — Patient Instructions (Signed)

## 2015-07-03 NOTE — Progress Notes (Signed)
Subjective:    Patient ID: Yolanda Morrison, female    DOB: [birth year], 29 y.o.   MRN: [id number]   Chief Complaint  Patient presents with  . Vaginitis    x3 days    HPI  For the past 3d has had some mild itchiness, clitoral discomfort and thick clumpy white discharge.  Urine looks a little cloudy.  OrthoEvra patch for birth control.  Did have UTI last month but took a fluconazole after.  Has had mult yeast infections per pt but this is only her second objectively diagnosed in the past yr.  Sees [facility].  History reviewed. No pertinent past medical history. History reviewed. No pertinent past surgical history. Current Outpatient Prescriptions on File Prior to Visit  Medication Sig Dispense Refill  . norelgestromin-ethinyl estradiol (ORTHO EVRA) 150-35 MCG/24HR transdermal patch Place 1 patch onto the skin once a week.     No current facility-administered medications on file prior to visit.   No Known Allergies Family History  Problem Relation Age of Onset  . Hypertension Father   . Hyperlipidemia Father   . Cancer Maternal Grandmother    Social History   Social History  . Marital Status: Single    Spouse Name: N/A  . Number of Children: N/A  . Years of Education: N/A   Social History Main Topics  . Smoking status: Never Smoker   . Smokeless tobacco: None  . Alcohol Use: No  . Drug Use: No  . Sexual Activity: Yes    Birth Control/ Protection: Patch     Comment: stopped ortho evra patch 04/16/2014   Other Topics Concern  . None   Social History Narrative     Review of Systems  Constitutional: Negative for fever, chills, diaphoresis, activity change, appetite change, fatigue and unexpected weight change.  Gastrointestinal: Negative for abdominal pain, diarrhea, constipation, blood in stool, anal bleeding and rectal pain.  Genitourinary: Positive for vaginal discharge and vaginal pain. Negative for dysuria, urgency, frequency, hematuria, flank pain,  decreased urine volume, vaginal bleeding, enuresis, difficulty urinating, genital sores, menstrual problem and pelvic pain.  Musculoskeletal: Negative for gait problem.  Skin: Negative for rash.  Hematological: Negative for adenopathy.  Psychiatric/Behavioral: The patient is not nervous/anxious.        Objective:  BP 108/70 mmHg  Pulse 76  Temp(Src) 98.2 F (36.8 C) (Oral)  Resp 18  Ht 5' 2.5" (1.588 m)  Wt 126 lb 3.2 oz (57.244 kg)  BMI 22.70 kg/m2  SpO2 99%  LMP 06/13/2015  Physical Exam  Constitutional: She is oriented to person, place, and time. She appears well-developed and well-nourished. No distress.  HENT:  Head: Normocephalic and atraumatic.  Right Ear: External ear normal.  Eyes: Conjunctivae are normal. No scleral icterus.  Pulmonary/Chest: Effort normal.  Genitourinary: There is no rash, tenderness or lesion on the right labia. There is no rash, tenderness or lesion on the left labia. Cervix exhibits discharge and friability. There is tenderness in the vagina. No erythema in the vagina. Vaginal discharge found.  Neurological: She is alert and oriented to person, place, and time.  Skin: Skin is warm and dry. She is not diaphoretic. No erythema.  Psychiatric: She has a normal mood and affect. Her behavior is normal.          Results for orders placed or performed in visit on 07/03/15  POCT Wet + KOH Prep  Result Value Ref Range   Yeast by KOH Present Present, Absent   Yeast by  wet prep Present Present, Absent   WBC by wet prep Moderate (A) None, Few, Too numerous to count   Clue Cells Wet Prep HPF POC None None, Too numerous to count   Trich by wet prep Absent Present, Absent   Bacteria Wet Prep HPF POC Moderate (A) None, Few, Too numerous to count   Epithelial Cells By [employer] ([facility]) Many (A) None, Few, Too numerous to count   RBC,UR,HPF,POC None None RBC/hpf  POCT urinalysis dipstick  Result Value Ref Range   Color, UA yellow yellow   Clarity, UA clear  clear   Glucose, UA negative negative   Bilirubin, UA negative negative   Ketones, POC UA negative negative   Spec Grav, UA 1.025    Blood, UA trace-lysed (A) negative   pH, UA 5.5    Protein Ur, POC trace (A) negative   Urobilinogen, UA 0.2    Nitrite, UA Negative Negative   Leukocytes, UA Trace (A) Negative  POCT Microscopic Urinalysis ([facility])  Result Value Ref Range   WBC,UR,HPF,POC Few (A) None WBC/hpf   RBC,UR,HPF,POC None None RBC/hpf   Bacteria Few (A) None, Too numerous to count   Mucus Present (A) Absent   Epithelial Cells, UR Per Microscopy Many (A) None, Too numerous to count cells/hpf   Yeast, UA Present     Assessment & Plan:   1. Vaginal discharge   2. Vaginal moniliasis   3. Yeast infection   Rec probiotics, come in for testing whenever sxs occur as if we have documented recurrence she may benefit from wkly fluconazole prophylasix for 6 mos.  Orders Placed This Encounter  Procedures  . POCT Wet + KOH Prep  . POCT urinalysis dipstick  . POCT Microscopic Urinalysis ([facility])    Meds ordered this encounter  Medications  . fluconazole (DIFLUCAN) 150 MG tablet    Sig: Take 1 tablet (150 mg total) by mouth once. Repeat if needed after 3 days.    Dispense:  2 tablet    Refill:  1     Norberto Sorenson, MD MPH

## 2015-09-18 ENCOUNTER — Ambulatory Visit (INDEPENDENT_AMBULATORY_CARE_PROVIDER_SITE_OTHER): Payer: Self-pay | Admitting: Family Medicine

## 2015-09-18 VITALS — BP 108/78 | HR 58 | Temp 98.2°F | Resp 18 | Ht 63.0 in | Wt 124.8 lb

## 2015-09-18 DIAGNOSIS — A499 Bacterial infection, unspecified: Secondary | ICD-10-CM

## 2015-09-18 DIAGNOSIS — N760 Acute vaginitis: Secondary | ICD-10-CM

## 2015-09-18 DIAGNOSIS — N898 Other specified noninflammatory disorders of vagina: Secondary | ICD-10-CM

## 2015-09-18 DIAGNOSIS — B9689 Other specified bacterial agents as the cause of diseases classified elsewhere: Secondary | ICD-10-CM

## 2015-09-18 LAB — POCT WET + KOH PREP
Trich by wet prep: ABSENT
YEAST BY KOH: ABSENT
Yeast by wet prep: ABSENT

## 2015-09-18 MED ORDER — METRONIDAZOLE 500 MG PO TABS: 500.0000 mg | ORAL_TABLET | Freq: Two times a day (BID) | ORAL | Status: DC

## 2015-09-18 NOTE — Patient Instructions (Signed)

## 2015-09-20 NOTE — Progress Notes (Signed)
Subjective:    Patient ID: Yolanda Morrison, female    DOB: 12/08/1985, 30 y.o.   MRN: [id number] Chief Complaint  Patient presents with  . Vaginal Discharge    x 1 week  . Vaginal odor    HPI  Has had problems with recurrent vag yest infxns for a long time but denies any h/o BV or STD.  After intercourse last wk, she has had increasing thin white vag discharge, strong fish-like odor, and over the past sev d mild intermittent lower abd pain.  Has been in a monoamouns relationship for 6 mos. On OCPs.  History reviewed. No pertinent past medical history. History reviewed. No pertinent past surgical history. Current Outpatient Prescriptions on File Prior to Visit  Medication Sig Dispense Refill  . norelgestromin-ethinyl estradiol (ORTHO EVRA) 150-35 MCG/24HR transdermal patch Place 1 patch onto the skin once a week.    . fluconazole (DIFLUCAN) 150 MG tablet Take 1 tablet (150 mg total) by mouth once. Repeat if needed after 3 days. (Patient not taking: Reported on 09/18/2015) 2 tablet 1   No current facility-administered medications on file prior to visit.   No Known Allergies Family History  Problem Relation Age of Onset  . Hypertension Father   . Hyperlipidemia Father   . Cancer Maternal Grandmother    Social History   Social History  . Marital Status: Single    Spouse Name: N/A  . Number of Children: N/A  . Years of Education: N/A   Social History Main Topics  . Smoking status: Never Smoker   . Smokeless tobacco: None  . Alcohol Use: No  . Drug Use: No  . Sexual Activity: Yes    Birth Control/ Protection: Patch     Comment: stopped ortho evra patch 04/16/2014   Other Topics Concern  . None   Social History Narrative       Review of Systems  Constitutional: Negative for fever, chills, diaphoresis, activity change, appetite change, fatigue and unexpected weight change.  Gastrointestinal: Negative for abdominal pain, diarrhea, constipation, blood in stool, anal  bleeding and rectal pain.  Genitourinary: Positive for vaginal discharge. Negative for dysuria, urgency, frequency, hematuria, decreased urine volume, vaginal bleeding, difficulty urinating, genital sores, vaginal pain, menstrual problem, pelvic pain and dyspareunia.  Musculoskeletal: Negative for gait problem.  Skin: Negative for rash.  Hematological: Negative for adenopathy.  Psychiatric/Behavioral: The patient is not nervous/anxious.        Objective:  BP 108/78 mmHg  Pulse 58  Temp(Src) 98.2 F (36.8 C) (Oral)  Resp 18  Ht  (1.6 m)  Wt 124 lb 12.8 oz (56.609 kg)  BMI 22.11 kg/m2  SpO2 98%  LMP 08/24/2015  Physical Exam  Constitutional: She is oriented to person, place, and time. She appears well-developed and well-nourished. No distress.  HENT:  Head: Normocephalic and atraumatic.  Right Ear: External ear normal.  Eyes: Conjunctivae are normal. No scleral icterus.  Pulmonary/Chest: Effort normal.  Genitourinary: There is no rash, tenderness or lesion on the right labia. There is no rash, tenderness or lesion on the left labia. Cervix exhibits discharge and friability. Vaginal discharge found.  Neurological: She is alert and oriented to person, place, and time.  Skin: Skin is warm and dry. She is not diaphoretic. No erythema.  Psychiatric: She has a normal mood and affect. Her behavior is normal.          Results for orders placed or performed in visit on 09/18/15  POCT Wet + KOH  Prep  Result Value Ref Range   Yeast by KOH Absent Present, Absent   Yeast by wet prep Absent Present, Absent   WBC by wet prep Too numerous to count  None, Few, Too numerous to count   Clue Cells Wet Prep HPF POC Few (A) None, Too numerous to count   Trich by wet prep Absent Present, Absent   Bacteria Wet Prep HPF POC Many (A) None, Few, Too numerous to count   Epithelial Cells By [employer] Pref (UMFC) Moderate (A) None, Few, Too numerous to count   RBC,UR,HPF,POC None None RBC/hpf     Assessment & Plan:   1. Vaginal discharge   2. Bacterial vaginosis   has had prob with recurrent vaginal moniliasis but denies any h/o BV prior to today.  Shel ost her health ins recently but is getting a new plan on the first of the month (4d) so advised pt to start flagyl now but RTC in 1 wk for repeat pelvic and full sti testing - esp since sxs started after intercourse and friable cervix.  No urine sxs. Pap smear done 09/2014 nml  Orders Placed This Encounter  Procedures  . POCT Wet + KOH Prep    Meds ordered this encounter  Medications  . metroNIDAZOLE (FLAGYL) 500 MG tablet    Sig: Take 1 tablet (500 mg total) by mouth 2 (two) times daily.    Dispense:  14 tablet    Refill:  0    Norberto Sorenson, MD MPH

## 2015-09-22 ENCOUNTER — Ambulatory Visit (INDEPENDENT_AMBULATORY_CARE_PROVIDER_SITE_OTHER): Payer: Self-pay | Admitting: Physician Assistant

## 2015-09-22 VITALS — BP 120/80 | HR 82 | Temp 100.0°F | Resp 20 | Ht 63.0 in | Wt 126.2 lb

## 2015-09-22 DIAGNOSIS — J029 Acute pharyngitis, unspecified: Secondary | ICD-10-CM

## 2015-09-22 DIAGNOSIS — R6889 Other general symptoms and signs: Secondary | ICD-10-CM

## 2015-09-22 LAB — POCT INFLUENZA A/B
INFLUENZA B, POC: NEGATIVE
Influenza A, POC: NEGATIVE

## 2015-09-22 MED ORDER — HYDROCODONE-HOMATROPINE 5-1.5 MG/5ML PO SYRP: 5.0000 mL | ORAL_SOLUTION | Freq: Four times a day (QID) | ORAL | Status: DC | PRN

## 2015-09-22 MED ORDER — GUAIFENESIN ER 1200 MG PO TB12: 1.0000 | ORAL_TABLET | Freq: Two times a day (BID) | ORAL | Status: AC

## 2015-09-22 NOTE — Patient Instructions (Signed)
Please push fluids.  Tylenol and Motrin for fever and body aches.    A humidifier can help especially when the air is dry -if you do not have a humidifier you can boil a pot of water on the stove in your home to help with the dry air.  

## 2015-09-22 NOTE — Progress Notes (Signed)
   Yolanda Morrison  MRN: [id number] DOB: Apr 28, 1986  Subjective:  Pt presents to clinic with feeling bad that started last night and has gotten worse as the day has gone on.  She has a sore throat and congestion with PND and a dry cough.  She has headaches and myalgias and feels terrible.  Sick contacts - work people Home treatment - motrin Flu vaccine - not this year  There are no active problems to display for this patient.   Current Outpatient Prescriptions on File Prior to Visit  Medication Sig Dispense Refill  . metroNIDAZOLE (FLAGYL) 500 MG tablet Take 1 tablet (500 mg total) by mouth 2 (two) times daily. 14 tablet 0  . norelgestromin-ethinyl estradiol (ORTHO EVRA) 150-35 MCG/24HR transdermal patch Place 1 patch onto the skin once a week. Reported on 09/22/2015     No current facility-administered medications on file prior to visit.    No Known Allergies  Review of Systems  Constitutional: Positive for fever and chills.  HENT: Positive for congestion, postnasal drip, rhinorrhea (clear) and sore throat.   Respiratory: Positive for cough (dry). Negative for shortness of breath and wheezing.        No h/o asthma, occ smoker - social  Gastrointestinal: Negative for nausea, vomiting and diarrhea.  Musculoskeletal: Positive for myalgias.  Neurological: Positive for headaches.   Objective:  BP 120/80 mmHg  Pulse 82  Temp(Src) 100 F (37.8 C) (Oral)  Resp 20  Ht  (1.6 m)  Wt 126 lb 3.2 oz (57.244 kg)  BMI 22.36 kg/m2  SpO2 98%  LMP 08/24/2015  Physical Exam  Constitutional: She is oriented to person, place, and time and well-developed, well-nourished, and in no distress.  HENT:  Head: Normocephalic and atraumatic.  Right Ear: Hearing, tympanic membrane, external ear and ear canal normal.  Left Ear: Hearing, tympanic membrane, external ear and ear canal normal.  Nose: Mucosal edema (red) and rhinorrhea (clear) present.  Mouth/Throat: Uvula is midline, oropharynx is  clear and moist and mucous membranes are normal.  Eyes: Conjunctivae are normal.  Neck: Normal range of motion.  Cardiovascular: Normal rate, regular rhythm and normal heart sounds.   No murmur heard. Pulmonary/Chest: Effort normal and breath sounds normal.  Neurological: She is alert and oriented to person, place, and time. Gait normal.  Skin: Skin is warm and dry.  Psychiatric: Mood, memory, affect and judgment normal.  Vitals reviewed.  Results for orders placed or performed in visit on 09/22/15  POCT Influenza A/B  Result Value Ref Range   Influenza A, POC Negative Negative   Influenza B, POC Negative Negative   Assessment and Plan :  Flu-like symptoms - Plan: POCT Influenza A/B, Guaifenesin (MUCINEX MAXIMUM STRENGTH) 1200 MG TB12, HYDROcodone-homatropine (HYCODAN) 5-1.5 MG/5ML syrup, Care order/instruction  Sore throat - Plan: CANCELED: POCT rapid strep A - pt did not want strep - she stated that if the sour throat did not resolve she would RTC  Symptomatic care d/w pt  Benny Lennert PA-C  [facility] 09/22/2015 8:30 PM

## 2015-11-08 ENCOUNTER — Ambulatory Visit (INDEPENDENT_AMBULATORY_CARE_PROVIDER_SITE_OTHER): Payer: Self-pay | Admitting: Physician Assistant

## 2015-11-08 VITALS — BP 112/78 | HR 57 | Temp 97.7°F | Resp 16 | Ht 63.0 in | Wt 122.0 lb

## 2015-11-08 DIAGNOSIS — Z3049 Encounter for surveillance of other contraceptives: Secondary | ICD-10-CM

## 2015-11-08 DIAGNOSIS — N898 Other specified noninflammatory disorders of vagina: Secondary | ICD-10-CM

## 2015-11-08 LAB — POCT WET + KOH PREP
Trich by wet prep: ABSENT
YEAST BY KOH: ABSENT
YEAST BY WET PREP: ABSENT

## 2015-11-08 MED ORDER — NORELGESTROMIN-ETH ESTRADIOL 150-35 MCG/24HR TD PTWK: 1.0000 | MEDICATED_PATCH | TRANSDERMAL | Status: DC

## 2015-11-08 NOTE — Progress Notes (Signed)
   Fredia BeetsJenelle Froberg  MRN: [id number] DOB: April 13, 1986  Subjective:  Pt presents to clinic 1- need a refill on her birth control - she is on the patch and she wants to continue that - currently sexual active - 1 female partner - lifetimes partners 4 all female - she is not worried about STDs and does not want to be tested for them 2- she had what she thinks was a yeast infection for the last 2 weeks - last night she took a diflucan and wants to make sure it is gone -   There are no active problems to display for this patient.   No current outpatient prescriptions on file prior to visit.   No current facility-administered medications on file prior to visit.    No Known Allergies  Review of Systems  Genitourinary: Positive for vaginal discharge. Negative for menstrual problem.   Objective:  BP 112/78 mmHg  Pulse 57  Temp(Src) 97.7 F (36.5 C) (Oral)  Resp 16  Ht 5\' 3"  (1.6 m)  Wt 122 lb (55.339 kg)  BMI 21.62 kg/m2  SpO2 98%  LMP 10/22/2015 (Approximate)  Physical Exam  Constitutional: She is oriented to person, place, and time and well-developed, well-nourished, and in no distress.  HENT:  Head: Normocephalic and atraumatic.  Right Ear: Hearing and external ear normal.  Left Ear: Hearing and external ear normal.  Eyes: Conjunctivae are normal.  Neck: Normal range of motion.  Cardiovascular: Normal rate, regular rhythm and normal heart sounds.   No murmur heard. Pulmonary/Chest: Effort normal and breath sounds normal. She has no wheezes.  Abdominal: Soft. Bowel sounds are normal.  Genitourinary: Uterus normal, cervix normal, right adnexa normal, left adnexa normal and vulva normal. Thin  odorless  white and vaginal discharge found.  Neurological: She is alert and oriented to person, place, and time. Gait normal.  Skin: Skin is warm and dry.  Psychiatric: Mood, memory, affect and judgment normal.  Vitals reviewed.  Results for orders placed or performed in visit on 11/08/15    POCT Wet + KOH Prep  Result Value Ref Range   Yeast by KOH Absent Present, Absent   Yeast by wet prep Absent Present, Absent   WBC by wet prep Many (A) None, Few, Too numerous to count   Clue Cells Wet Prep HPF POC None None, Too numerous to count   Trich by wet prep Absent Present, Absent   Bacteria Wet Prep HPF POC Few None, Few, Too numerous to count   Epithelial Cells By [employer] Pref (UMFC) Moderate (A) None, Few, Too numerous to count   RBC,UR,HPF,POC None None RBC/hpf    Assessment and Plan :  Vaginal discharge - Plan: POCT Wet + KOH Prep  Encounter for surveillance of other contraceptive - Plan: norelgestromin-ethinyl estradiol (ORTHO EVRA) 150-35 MCG/24HR transdermal patch     Benny LennertSarah Weber PA-C  [facility] 11/08/2015 11:46 AM

## 2015-11-08 NOTE — Patient Instructions (Addendum)
     IF you received an x-ray today, you will receive an invoice from [facility]. Please contact [facility] at [phone] with questions or concerns regarding your invoice.   IF you received labwork today, you will receive an invoice from [facility]. Please contact [facility] at [phone] with questions or concerns regarding your invoice.   Our billing staff will not be able to assist you with questions regarding bills from these companies.  You will be contacted with the lab results as soon as they are available. The fastest way to get your results is to activate your My Chart account. Instructions are located on the last page of this paperwork. If you have not heard from us regarding the results in 2 weeks, please contact this office.      

## 2015-12-23 ENCOUNTER — Ambulatory Visit (INDEPENDENT_AMBULATORY_CARE_PROVIDER_SITE_OTHER): Payer: Self-pay | Admitting: Family Medicine

## 2015-12-23 VITALS — BP 110/78 | HR 68 | Temp 97.3°F | Resp 15 | Ht 63.0 in | Wt 122.0 lb

## 2015-12-23 DIAGNOSIS — B373 Candidiasis of vulva and vagina: Secondary | ICD-10-CM

## 2015-12-23 DIAGNOSIS — B9689 Other specified bacterial agents as the cause of diseases classified elsewhere: Secondary | ICD-10-CM

## 2015-12-23 DIAGNOSIS — B3731 Acute candidiasis of vulva and vagina: Secondary | ICD-10-CM

## 2015-12-23 DIAGNOSIS — A499 Bacterial infection, unspecified: Secondary | ICD-10-CM

## 2015-12-23 DIAGNOSIS — N760 Acute vaginitis: Secondary | ICD-10-CM

## 2015-12-23 DIAGNOSIS — N898 Other specified noninflammatory disorders of vagina: Secondary | ICD-10-CM

## 2015-12-23 LAB — POCT WET + KOH PREP
TRICH BY WET PREP: ABSENT
YEAST BY WET PREP: ABSENT

## 2015-12-23 MED ORDER — METRONIDAZOLE 500 MG PO TABS: 500.0000 mg | ORAL_TABLET | Freq: Two times a day (BID) | ORAL | Status: DC

## 2015-12-23 MED ORDER — NYSTATIN 100000 UNIT/GM EX CREA: 1.0000 "application " | TOPICAL_CREAM | Freq: Two times a day (BID) | CUTANEOUS | Status: DC

## 2015-12-23 MED ORDER — FLUCONAZOLE 150 MG PO TABS: 150.0000 mg | ORAL_TABLET | Freq: Once | ORAL | Status: DC

## 2015-12-23 NOTE — Progress Notes (Signed)
By signing my name below I, Shelah LewandowskyJoseph Thomas, attest that this documentation has been prepared under the direction and in the presence of Norberto SorensonEva Shaw, MD. Electonically Signed. Shelah LewandowskyJoseph Thomas, Scribe 12/23/2015 at 3:06 PM   Subjective:    Patient ID: Yolanda Morrison, female    DOB: 05/20/1986, 29 y.o.   MRN: [id number]  Chief Complaint  Patient presents with  . Vaginal Pain    x 2 weeks  . Vaginal Discharge    HPI Yolanda Morrison is a 30 y.o. female who [facility] complaining of vaginal pain with vaginal discharge for the past 2 weeks. Pt also reports having 3 weeks of external vaginal itching which has extended to the rectum and perineum area. Pt's vaginal discharge was initially thick and has become thinner over time. Pt tried monistat for 1 day with no relief. Pt denies any change in sexual partners. Pt's last menstrual period was a little late which pt thinks was due to increased stress and was otherwise normal.   Pt has been seen multiple times for similar symptoms. Pt was last seen 6 weeks prior to today and with normal pelvic exam, normal vaginal discharge, and normal urinalysis. Pt has a history of recurrent vaginal yeast infections. Pt is sexually active in a monogamous relationship, no concern for STDs. Pt uses patch birth control.  There are no active problems to display for this patient.  Current Outpatient Prescriptions on File Prior to Visit  Medication Sig Dispense Refill  . norelgestromin-ethinyl estradiol (ORTHO EVRA) 150-35 MCG/24HR transdermal patch Place 1 patch onto the skin once a week. Reported on 11/08/2015 3 patch 12   No current facility-administered medications on file prior to visit.   No Known Allergies  [facility] 12/23/2015 11/08/2015 09/18/2015 07/03/2015 04/16/2015  Decreased Interest 0 0 0 0 0  Down, Depressed, Hopeless 0 0 0 0 0  PHQ - 2 Score 0 0 0 0 0      Review of Systems  Constitutional: Negative for  fever.  HENT: Negative for congestion.   Eyes: Negative for visual disturbance.  Respiratory: Negative for cough.   Cardiovascular: Negative for chest pain.  Gastrointestinal: Negative for abdominal pain.  Genitourinary: Positive for vaginal discharge and vaginal pain.  Musculoskeletal: Negative for back pain.  Skin: Positive for rash (vaginal, perianal, and anal itching).  Neurological: Negative for headaches.  Psychiatric/Behavioral: Negative for sleep disturbance.       Pt is positive for increased stress.       Objective:  BP 110/78 mmHg  Pulse 68  Temp(Src) 97.3 F (36.3 C) (Oral)  Resp 15  Ht 5\' 3"  (1.6 m)  Wt 122 lb (55.339 kg)  BMI 21.62 kg/m2  SpO2 99%  LMP 11/26/2015  Physical Exam  Constitutional: She is oriented to person, place, and time. She appears well-developed and well-nourished. No distress.  HENT:  Head: Normocephalic and atraumatic.  Eyes: Conjunctivae are normal. Pupils are equal, round, and reactive to light.  Neck: Neck supple.  Cardiovascular: Normal rate.   Pulmonary/Chest: Effort normal.  Genitourinary:  Labia majora and skin of perineum are normal.  Mucosa between labia has significant erythema and is coated with thick white discharge. Pt also has thin white discharge from introitus. Cervix is tender. There is no uterine or adnexa tenderness. Rectum normal with no mass or hemorrhoids.   Pt had significant stool burden.  Musculoskeletal: Normal range of motion.  Neurological: She is alert and oriented to  person, place, and time.  Skin: Skin is warm and dry.  Psychiatric: She has a normal mood and affect. Her behavior is normal.  Nursing note and vitals reviewed.  Results for orders placed or performed in visit on 12/23/15  POCT Wet + KOH Prep  Result Value Ref Range   Yeast by KOH Present Present, Absent   Yeast by wet prep Absent Present, Absent   WBC by wet prep Few None, Few, Too numerous to count   Clue Cells Wet Prep HPF POC Moderate  (A) None, Too numerous to count   Trich by wet prep Absent Present, Absent   Bacteria Wet Prep HPF POC Many (A) None, Few, Too numerous to count   Epithelial Cells By [employer] Pref (UMFC) Moderate (A) None, Few, Too numerous to count   RBC,UR,HPF,POC None None RBC/hpf          Assessment & Plan:   1. Vaginal discharge   2. Bacterial vaginosis   3. Vaginal candidiasis     Orders Placed This Encounter  Procedures  . POCT Wet + KOH Prep    Meds ordered this encounter  Medications  . fluconazole (DIFLUCAN) 150 MG tablet    Sig: Take 1 tablet (150 mg total) by mouth once. Repeat every 3 days until symptoms are completely gone    Dispense:  7 tablet    Refill:  0  . nystatin cream (MYCOSTATIN)    Sig: Apply 1 application topically 2 (two) times daily.    Dispense:  30 g    Refill:  1  . metroNIDAZOLE (FLAGYL) 500 MG tablet    Sig: Take 1 tablet (500 mg total) by mouth 2 (two) times daily.    Dispense:  14 tablet    Refill:  0    I personally performed the services described in this documentation, which was scribed in my presence. The recorded information has been reviewed and considered, and addended by me as needed.   Norberto Sorenson, M.D.  [facility] [address] [place], [place] [address] [phone] phone [phone] fax  12/27/2015 10:31 PM

## 2015-12-23 NOTE — Patient Instructions (Addendum)
     IF you received an x-ray today, you will receive an invoice from [facility]. Please contact [facility] at [phone] with questions or concerns regarding your invoice.   IF you received labwork today, you will receive an invoice from [facility]. Please contact [facility] at [phone] with questions or concerns regarding your invoice.   Our billing staff will not be able to assist you with questions regarding bills from these companies.  You will be contacted with the lab results as soon as they are available. The fastest way to get your results is to activate your My Chart account. Instructions are located on the last page of this paperwork. If you have not heard from us regarding the results in 2 weeks, please contact this office.     Cutaneous Candidiasis Cutaneous candidiasis is a condition in which there is an overgrowth of yeast (candida) on the skin. Yeast normally live on the skin, but in small enough numbers not to cause any symptoms. In certain cases, increased growth of the yeast may cause an actual yeast infection. This kind of infection usually occurs in areas of the skin that are constantly warm and moist, such as the armpits or the groin. Yeast is the most common cause of diaper rash in babies and in people who cannot control their bowel movements (incontinence). CAUSES  The fungus that most often causes cutaneous candidiasis is Candida albicans. Conditions that can increase the risk of getting a yeast infection of the skin include:  Obesity.  Pregnancy.  Diabetes.  Taking antibiotic medicine.  Taking birth control pills.  Taking steroid medicines.  Thyroid disease.  An iron or zinc deficiency.  Problems with the immune system. SYMPTOMS   Red, swollen area of the skin.  Bumps on the skin.  Itchiness. DIAGNOSIS  The diagnosis of cutaneous candidiasis is usually based on its appearance. Light scrapings of  the skin may also be taken and viewed under a microscope to identify the presence of yeast. TREATMENT  Antifungal creams may be applied to the infected skin. In severe cases, oral medicines may be needed.  HOME CARE INSTRUCTIONS   Keep your skin clean and dry.  Maintain a healthy weight.  If you have diabetes, keep your blood sugar under control. SEEK IMMEDIATE MEDICAL CARE IF:  Your rash continues to spread despite treatment.  You have a fever, chills, or abdominal pain.   This information is not intended to replace advice given to you by your health care provider. Make sure you discuss any questions you have with your health care provider.   Document Released: 03/27/2011 Document Revised: 10/01/2011 Document Reviewed: 01/10/2015 Elsevier Interactive Patient Education 2016 [employer] Inc[employer]   

## 2016-01-05 ENCOUNTER — Telehealth: Payer: Self-pay

## 2016-01-05 NOTE — Telephone Encounter (Signed)
I spoke with and advised she stop. She has an obgyn appointment tomorrow and will discuss with them.

## 2016-01-05 NOTE — Telephone Encounter (Signed)
Pt is taking flagyl and has found out that she has tested posted positive for a pregnancy test and wants to know if she should stop the flagyl  Best number [phone]

## 2016-02-14 ENCOUNTER — Ambulatory Visit: Payer: Self-pay

## 2016-05-31 ENCOUNTER — Other Ambulatory Visit: Payer: Self-pay

## 2016-05-31 NOTE — Telephone Encounter (Signed)
Pt will need an OV for this Rx to be written for her so we can determine if she has a yeast infection.

## 2016-05-31 NOTE — Telephone Encounter (Signed)
Patient called asking for a refill for her diflucan. She states she gets yeast once and a while and this is the only thing that works. Denies fever,odor or other d/c. Has itching. Last seen 12/2015

## 2016-06-04 NOTE — Telephone Encounter (Signed)
L/m with providers response

## 2017-08-20 ENCOUNTER — Other Ambulatory Visit: Payer: Self-pay | Admitting: Orthopedic Surgery

## 2017-08-20 DIAGNOSIS — R52 Pain, unspecified: Secondary | ICD-10-CM

## 2017-08-25 ENCOUNTER — Other Ambulatory Visit: Payer: Self-pay

## 2017-08-28 ENCOUNTER — Ambulatory Visit
Admission: RE | Admit: 2017-08-28 | Discharge: 2017-08-28 | Disposition: A | Discharge status: Home / Self Care | Payer: 59 | Source: Ambulatory Visit | Attending: Orthopedic Surgery | Admitting: Orthopedic Surgery

## 2017-08-28 DIAGNOSIS — R52 Pain, unspecified: Secondary | ICD-10-CM

## 2017-10-09 ENCOUNTER — Encounter (HOSPITAL_BASED_OUTPATIENT_CLINIC_OR_DEPARTMENT_OTHER): Payer: Self-pay | Admitting: *Deleted

## 2017-10-09 ENCOUNTER — Other Ambulatory Visit: Payer: Self-pay

## 2017-10-09 NOTE — Progress Notes (Signed)
[facility] thanks

## 2017-10-09 NOTE — Progress Notes (Addendum)
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

## 2017-10-17 NOTE — Progress Notes (Signed)
LEFT MESSAGE FOR PATIENT TO CALL AND GET HIBICLENS SHOWER INSTRUCTIONS

## 2017-10-17 NOTE — Progress Notes (Signed)
SPOKE WITH PATIENT BY PHONE AND PATIENT INSTRUCTED HIBICLENS SHOWER AM OF SURGERY PER DR Thayer OhmOGERS ORDERS

## 2017-10-18 ENCOUNTER — Ambulatory Visit (HOSPITAL_BASED_OUTPATIENT_CLINIC_OR_DEPARTMENT_OTHER): Payer: 59 | Admitting: Anesthesiology

## 2017-10-18 ENCOUNTER — Encounter (HOSPITAL_BASED_OUTPATIENT_CLINIC_OR_DEPARTMENT_OTHER): Payer: Self-pay

## 2017-10-18 ENCOUNTER — Ambulatory Visit (HOSPITAL_BASED_OUTPATIENT_CLINIC_OR_DEPARTMENT_OTHER)
Admission: RE | Admit: 2017-10-18 | Discharge: 2017-10-18 | Disposition: A | Discharge status: Home / Self Care | Payer: 59 | Source: Ambulatory Visit | Attending: Orthopedic Surgery | Admitting: Orthopedic Surgery

## 2017-10-18 ENCOUNTER — Encounter (HOSPITAL_BASED_OUTPATIENT_CLINIC_OR_DEPARTMENT_OTHER)
Admission: RE | Disposition: A | Discharge status: Home / Self Care | Payer: Self-pay | Source: Ambulatory Visit | Attending: Orthopedic Surgery

## 2017-10-18 ENCOUNTER — Other Ambulatory Visit: Payer: Self-pay

## 2017-10-18 DIAGNOSIS — M65862 Other synovitis and tenosynovitis, left lower leg: Secondary | ICD-10-CM | POA: Insufficient documentation

## 2017-10-18 DIAGNOSIS — M23042 Cystic meniscus, anterior horn of lateral meniscus, left knee: Secondary | ICD-10-CM | POA: Diagnosis not present

## 2017-10-18 DIAGNOSIS — S83282A Other tear of lateral meniscus, current injury, left knee, initial encounter: Secondary | ICD-10-CM | POA: Diagnosis present

## 2017-10-18 HISTORY — PX: KNEE ARTHROSCOPY WITH LATERAL MENISECTOMY: SHX6193

## 2017-10-18 HISTORY — DX: Reserved for inherently not codable concepts without codable children: IMO0001

## 2017-10-18 LAB — POCT PREGNANCY, URINE: Preg Test, Ur: NEGATIVE

## 2017-10-18 LAB — POCT HEMOGLOBIN-HEMACUE: Hemoglobin: 13 g/dL (ref 12.0–15.0)

## 2017-10-18 SURGERY — ARTHROSCOPY, KNEE, WITH LATERAL MENISCECTOMY
Anesthesia: General | Laterality: Left

## 2017-10-18 MED ORDER — HYDROCODONE-ACETAMINOPHEN 5-325 MG PO TABS: 1.0000 | ORAL_TABLET | Freq: Four times a day (QID) | ORAL | 0 refills | Status: AC | PRN

## 2017-10-18 MED ORDER — DEXAMETHASONE SODIUM PHOSPHATE 10 MG/ML IJ SOLN
INTRAMUSCULAR | Status: AC
  Filled 2017-10-18: qty 1

## 2017-10-18 MED ORDER — CEFAZOLIN SODIUM-DEXTROSE 2-4 GM/100ML-% IV SOLN
INTRAVENOUS | Status: AC
  Filled 2017-10-18: qty 100

## 2017-10-18 MED ORDER — ONDANSETRON HCL 4 MG/2ML IJ SOLN
4.0000 mg | Freq: Once | INTRAMUSCULAR | Status: DC | PRN
Start: 2017-10-18 — End: 2017-10-18
  Filled 2017-10-18: qty 2

## 2017-10-18 MED ORDER — MEPERIDINE HCL 25 MG/ML IJ SOLN
6.2500 mg | INTRAMUSCULAR | Status: DC | PRN
  Filled 2017-10-18: qty 1

## 2017-10-18 MED ORDER — PROPOFOL 10 MG/ML IV BOLUS
INTRAVENOUS | Status: AC
  Filled 2017-10-18: qty 20

## 2017-10-18 MED ORDER — MIDAZOLAM HCL 2 MG/2ML IJ SOLN
INTRAMUSCULAR | Status: AC
  Filled 2017-10-18: qty 2

## 2017-10-18 MED ORDER — PROPOFOL 10 MG/ML IV BOLUS
INTRAVENOUS | Status: DC | PRN
  Administered 2017-10-18: 160 mg via INTRAVENOUS
  Administered 2017-10-18: 40 mg via INTRAVENOUS
  Administered 2017-10-18: 30 mg via INTRAVENOUS

## 2017-10-18 MED ORDER — ONDANSETRON 4 MG PO TBDP: 4.0000 mg | ORAL_TABLET | Freq: Three times a day (TID) | ORAL | 0 refills | Status: AC | PRN

## 2017-10-18 MED ORDER — LACTATED RINGERS IV SOLN
INTRAVENOUS | Status: DC
  Administered 2017-10-18: 12:00:00 via INTRAVENOUS
  Filled 2017-10-18: qty 1000

## 2017-10-18 MED ORDER — LIDOCAINE 2% (20 MG/ML) 5 ML SYRINGE
INTRAMUSCULAR | Status: DC | PRN
  Administered 2017-10-18: 100 mg via INTRAVENOUS

## 2017-10-18 MED ORDER — KETOROLAC TROMETHAMINE 30 MG/ML IJ SOLN
INTRAMUSCULAR | Status: DC | PRN
  Administered 2017-10-18: 30 mg via INTRAVENOUS

## 2017-10-18 MED ORDER — BUPIVACAINE-EPINEPHRINE 0.25% -1:200000 IJ SOLN
INTRAMUSCULAR | Status: DC | PRN
  Administered 2017-10-18: 20 mL

## 2017-10-18 MED ORDER — CEFAZOLIN SODIUM-DEXTROSE 2-4 GM/100ML-% IV SOLN
2.0000 g | INTRAVENOUS | Status: AC
  Administered 2017-10-18: 2 g via INTRAVENOUS
  Filled 2017-10-18: qty 100

## 2017-10-18 MED ORDER — HYDROMORPHONE HCL 1 MG/ML IJ SOLN
0.2500 mg | INTRAMUSCULAR | Status: DC | PRN
  Filled 2017-10-18: qty 0.5

## 2017-10-18 MED ORDER — KETOROLAC TROMETHAMINE 30 MG/ML IJ SOLN
INTRAMUSCULAR | Status: AC
  Filled 2017-10-18: qty 1

## 2017-10-18 MED ORDER — CHLORHEXIDINE GLUCONATE 4 % EX LIQD
60.0000 mL | Freq: Once | CUTANEOUS | Status: DC
  Filled 2017-10-18: qty 118

## 2017-10-18 MED ORDER — ONDANSETRON HCL 4 MG/2ML IJ SOLN
INTRAMUSCULAR | Status: AC
  Filled 2017-10-18: qty 2

## 2017-10-18 MED ORDER — FENTANYL CITRATE (PF) 100 MCG/2ML IJ SOLN
INTRAMUSCULAR | Status: DC | PRN
  Administered 2017-10-18 (×2): 50 ug via INTRAVENOUS

## 2017-10-18 MED ORDER — ONDANSETRON HCL 4 MG/2ML IJ SOLN
INTRAMUSCULAR | Status: DC | PRN
  Administered 2017-10-18: 4 mg via INTRAVENOUS

## 2017-10-18 MED ORDER — SODIUM CHLORIDE 0.9 % IR SOLN
Status: DC | PRN
  Administered 2017-10-18: 6000 mL

## 2017-10-18 MED ORDER — FENTANYL CITRATE (PF) 100 MCG/2ML IJ SOLN
INTRAMUSCULAR | Status: AC
  Filled 2017-10-18: qty 2

## 2017-10-18 MED ORDER — MIDAZOLAM HCL 5 MG/5ML IJ SOLN
INTRAMUSCULAR | Status: DC | PRN
  Administered 2017-10-18: 2 mg via INTRAVENOUS

## 2017-10-18 MED ORDER — LIDOCAINE 2% (20 MG/ML) 5 ML SYRINGE
INTRAMUSCULAR | Status: AC
  Filled 2017-10-18: qty 5

## 2017-10-18 MED ORDER — DEXAMETHASONE SODIUM PHOSPHATE 10 MG/ML IJ SOLN
INTRAMUSCULAR | Status: DC | PRN
  Administered 2017-10-18: 10 mg via INTRAVENOUS

## 2017-10-18 SURGICAL SUPPLY — 39 items
BANDAGE ELASTIC 6 VELCRO ST LF (GAUZE/BANDAGES/DRESSINGS) ×3 IMPLANT
BANDAGE ESMARK 6X9 LF (GAUZE/BANDAGES/DRESSINGS) IMPLANT
BLADE CUDA GRT WHITE 3.5 (BLADE) IMPLANT
BLADE CUTTER GATOR 3.5 (BLADE) IMPLANT
BLADE GREAT WHITE 4.2 (BLADE) IMPLANT
BLADE GREAT WHITE 4.2MM (BLADE)
BNDG CMPR 9X6 STRL LF SNTH (GAUZE/BANDAGES/DRESSINGS)
BNDG ESMARK 6X9 LF (GAUZE/BANDAGES/DRESSINGS)
BNDG GAUZE ELAST 4 BULKY (GAUZE/BANDAGES/DRESSINGS) ×2 IMPLANT
CUFF TOURNIQUET SINGLE 34IN LL (TOURNIQUET CUFF) ×3 IMPLANT
DRAPE ARTHROSCOPY W/POUCH 114 (DRAPES) ×3 IMPLANT
DRAPE U-SHAPE 47X51 STRL (DRAPES) ×3 IMPLANT
DRSG PAD ABDOMINAL 8X10 ST (GAUZE/BANDAGES/DRESSINGS) ×3 IMPLANT
DURAPREP 26ML APPLICATOR (WOUND CARE) ×3 IMPLANT
GAUZE SPONGE 4X4 12PLY STRL (GAUZE/BANDAGES/DRESSINGS) ×3 IMPLANT
GAUZE XEROFORM 1X8 LF (GAUZE/BANDAGES/DRESSINGS) ×3 IMPLANT
GLOVE BIO SURGEON STRL SZ7.5 (GLOVE) ×3 IMPLANT
GLOVE BIOGEL PI IND STRL 8 (GLOVE) ×1 IMPLANT
GLOVE BIOGEL PI INDICATOR 8 (GLOVE) ×2
GOWN STRL REUS W/TWL LRG LVL3 (GOWN DISPOSABLE) IMPLANT
IV NS IRRIG 3000ML ARTHROMATIC (IV SOLUTION) ×6 IMPLANT
KIT TURNOVER CYSTO (KITS) ×3 IMPLANT
KNEE WRAP E Z 3 GEL PACK (MISCELLANEOUS) ×3 IMPLANT
MANIFOLD NEPTUNE II (INSTRUMENTS) ×3 IMPLANT
PACK ARTHROSCOPY DSU (CUSTOM PROCEDURE TRAY) ×3 IMPLANT
PACK BASIN DAY SURGERY FS (CUSTOM PROCEDURE TRAY) ×3 IMPLANT
PAD ABD 8X10 STRL (GAUZE/BANDAGES/DRESSINGS) ×2 IMPLANT
PAD ARMBOARD 7.5X6 YLW CONV (MISCELLANEOUS) IMPLANT
PROBE BIPOLAR 50 DEGREE SUCT (MISCELLANEOUS) IMPLANT
PROBE BIPOLAR ATHRO 135MM 90D (MISCELLANEOUS) IMPLANT
SET ARTHROSCOPY TUBING (MISCELLANEOUS) ×3
SET ARTHROSCOPY TUBING LN (MISCELLANEOUS) ×1 IMPLANT
SHAVER 4.2 MM LANZA 9391A (BLADE) ×3 IMPLANT
SYR CONTROL 10ML LL (SYRINGE) ×3 IMPLANT
TOWEL OR 17X24 6PK STRL BLUE (TOWEL DISPOSABLE) ×3 IMPLANT
TUBE CONNECTING 12'X1/4 (SUCTIONS) ×1
TUBE CONNECTING 12X1/4 (SUCTIONS) ×2 IMPLANT
WAND 30 DEG SABER W/CORD (SURGICAL WAND) IMPLANT
WATER STERILE IRR 500ML POUR (IV SOLUTION) ×3 IMPLANT

## 2017-10-18 NOTE — Anesthesia Postprocedure Evaluation (Signed)
Anesthesia Post Note  Patient: Yolanda Morrison  Procedure(s) Performed: Left knee arthroscopy partial lateral menisectomy (Left )     Patient location during evaluation: PACU Anesthesia Type: General Level of consciousness: awake Pain management: pain level controlled Vital Signs Assessment: post-procedure vital signs reviewed and stable Respiratory status: spontaneous breathing Cardiovascular status: stable Anesthetic complications: no    Last Vitals:  Vitals:   10/18/17 1046 10/18/17 1330  BP: 117/78 115/79  Pulse: 69 79  Resp: 16 18  Temp: 36.5 C 36.5 C  SpO2: 100% 99%    Last Pain:  Vitals:   10/18/17 1330  TempSrc:   PainSc: 0-No pain                 Breasia Karges

## 2017-10-18 NOTE — Op Note (Signed)
Surgery Date: 10/18/2017  Surgeon(s): Yolonda Kidaogers, Sayge Brienza Patrick, MD  ANESTHESIA:  general  FLUIDS: Per anesthesia record.   ESTIMATED BLOOD LOSS: minimal  PREOPERATIVE DIAGNOSES:  1.  Left knee lateral meniscus tear 2.  Left knee synovitis with lateral meniscus para meniscal cyst.  POSTOPERATIVE DIAGNOSES:  same  PROCEDURES PERFORMED:  1.  Left knee arthroscopy with arthroscopic partial lateral meniscectomy   DESCRIPTION OF PROCEDURE: Ms. Yolanda Morrison is a 32 y.o.-year-old female with left knee lateral meniscus tear with para meniscal cyst. Plans are to proceed with partial lateral meniscectomy and diagnostic arthroscopy with debridement as indicated. Full discussion held regarding risks benefits alternatives and complications related surgical intervention. Conservative care options reviewed. All questions answered.  The patient was identified in the preoperative holding area and the operative extremity was marked. The patient was brought to the operating room and transferred to operating table in a supine position. Satisfactory general anesthesia was induced by anesthesiology.    Standard anterolateral, anteromedial arthroscopy portals were obtained. The anteromedial portal was obtained with a spinal needle for localization under direct visualization with subsequent diagnostic findings.   Anteromedial and anterolateral chambers: moderate synovitis. The synovitis was debrided with a 4.5 mm full radius shaver through both the anteromedial and lateral portals.   Suprapatellar pouch and gutters: no synovitis or debris. Patella chondral surface: Grade 0 Trochlear chondral surface: Grade 0 Patellofemoral tracking: Midline Medial meniscus: Intact.  Medial femoral condyle flexion bearing surface: Grade 0 Medial femoral condyle extension bearing surface: Grade 0 Medial tibial plateau: Grade 0 Anterior cruciate ligament:stable Posterior cruciate ligament:stable Lateral meniscus: Split  longitudinal tearing as well as capsular detachment of the anterior horn with associated para meniscal cyst at the anterior root attachment.   Lateral femoral condyle flexion bearing surface: Grade 0 Lateral femoral condyle extension bearing surface: Grade 0 Lateral tibial plateau: Grade 0   We performed a partial anterior horn lateral meniscectomy with motorized shaver.  Care was taken not to detach the anterior root attachment.  Subsequent to that we utilized the 11 blade knife as well as a motorized shaver to decompress the para meniscal cyst anteriorly.  Following completion photographs were taken throughout the procedure.  Excess fluid was drained from the knee.  Portal sites were closed with 3-0 nylon.  Sterile bandages were applied in standard fashion.  All counts were correct x2.  There were no intraoperative complications.  DISPOSITION: The patient was awakened from general anesthetic, extubated, taken to the recovery room in medically stable condition, no apparent complications. The patient may be weightbearing as tolerated to the operative lower extremity.  Range of motion of right knee as tolerated.

## 2017-10-18 NOTE — H&P (Signed)
ORTHOPAEDIC H and P  REQUESTING PHYSICIAN: Yolonda Kidaogers, Clair Alfieri Patrick, MD  PCP:  Patient, No Pcp Per  Chief Complaint: Left knee lateral meniscus tear  HPI: Yolanda Morrison is a 32 y.o. female who complains of chronic left knee pain.  She has had extensive conservative management including injections and physical therapy with oral medications.  She has presented today for arthroscopic management of her left knee.  She has no new complaints at this time.  She is ready to move forward with surgery.  Past Medical History:  Diagnosis Date  . Cold    sore throat   Past Surgical History:  Procedure Laterality Date  . NO PAST SURGERIES     Social History   Socioeconomic History  . Marital status: Single    Spouse name: Not on file  . Number of children: Not on file  . Years of education: Not on file  . Highest education level: Not on file  Occupational History  . Not on file  Social Needs  . Financial resource strain: Not on file  . Food insecurity:    Worry: Not on file    Inability: Not on file  . Transportation needs:    Medical: Not on file    Non-medical: Not on file  Tobacco Use  . Smoking status: Never Smoker  . Smokeless tobacco: Never Used  Substance and Sexual Activity  . Alcohol use: No    Alcohol/week: 0.0 oz  . Drug use: No  . Sexual activity: Yes  Lifestyle  . Physical activity:    Days per week: Not on file    Minutes per session: Not on file  . Stress: Not on file  Relationships  . Social connections:    Talks on phone: Not on file    Gets together: Not on file    Attends religious service: Not on file    Active member of club or organization: Not on file    Attends meetings of clubs or organizations: Not on file    Relationship status: Not on file  Other Topics Concern  . Not on file  Social History Narrative   Single - in a relationship   Son - born 2009 -   Work - post office   Family History  Problem Relation Age of Onset  .  Hypertension Father   . Hyperlipidemia Father   . Cancer Maternal Grandmother    No Known Allergies Prior to Admission medications   Not on File   No results found.  Positive ROS: All other systems have been reviewed and were otherwise negative with the exception of those mentioned in the HPI and as above.  Physical Exam: General: Alert, no acute distress Cardiovascular: No pedal edema Respiratory: No cyanosis, no use of accessory musculature GI: No organomegaly, abdomen is soft and non-tender Skin: No lesions in the area of chief complaint Neurologic: Sensation intact distally Psychiatric: Patient is competent for consent with normal mood and affect Lymphatic: No axillary or cervical lymphadenopathy    Assessment: 1.  Left knee lateral para meniscal cyst 2.  Left knee lateral meniscus tear.  Plan: -Plan for operative interventions today with partial lateral meniscectomy and debridement as indicated. -We again reviewed the risk, benefits, and indications of this procedure at length.  All questions were solicited and answered to her satisfaction.  She has provided informed consent. -We will plan for discharge home from PACU.    Yolonda KidaJason Patrick Calub Tarnow, MD Cell [phone]  10/18/2017 12:22 PM

## 2017-10-18 NOTE — Transfer of Care (Signed)
Immediate Anesthesia Transfer of Care Note  Patient: Yolanda Morrison  Procedure(s) Performed: Left knee arthroscopy partial lateral menisectomy (Left )  Patient Location: PACU  Anesthesia Type:General  Level of Consciousness: awake and oriented  Airway & Oxygen Therapy: Patient Spontanous Breathing and Patient connected to nasal cannula oxygen  Post-op Assessment: Report given to RN  Post vital signs: Reviewed and stable Last Vitals: 115/79, 80, 18, 99%, 97.7 Vitals Value Taken Time  BP    Temp    Pulse    Resp    SpO2      Last Pain:  Vitals:   10/18/17 1111  TempSrc:   PainSc: 0-No pain         Complications: No apparent anesthesia complications

## 2017-10-18 NOTE — Anesthesia Preprocedure Evaluation (Addendum)
Anesthesia Evaluation  Patient identified by MRN, date of birth, ID band Patient awake    Reviewed: Allergy & Precautions, NPO status , Patient's Chart, lab work & pertinent test results  Airway Mallampati: I  TM Distance: >3 FB Neck ROM: Full    Dental   Pulmonary neg pulmonary ROS,    Pulmonary exam normal breath sounds clear to auscultation       Cardiovascular negative cardio ROS Normal cardiovascular exam Rhythm:Regular Rate:Normal     Neuro/Psych    GI/Hepatic negative GI ROS, Neg liver ROS,   Endo/Other  negative endocrine ROS  Renal/GU negative Renal ROS     Musculoskeletal   Abdominal   Peds  Hematology negative hematology ROS (+)   Anesthesia Other Findings   Reproductive/Obstetrics                            Anesthesia Physical Anesthesia Plan  ASA: I  Anesthesia Plan: General   Post-op Pain Management:    Induction: Intravenous  PONV Risk Score and Plan: 3 and Midazolam, Dexamethasone and Ondansetron  Airway Management Planned: LMA  Additional Equipment:   Intra-op Plan:   Post-operative Plan: Extubation in OR  Informed Consent: I have reviewed the patients History and Physical, chart, labs and discussed the procedure including the risks, benefits and alternatives for the proposed anesthesia with the patient or authorized representative who has indicated his/her understanding and acceptance.     Plan Discussed with: CRNA, Surgeon and Anesthesiologist  Anesthesia Plan Comments:        Anesthesia Quick Evaluation

## 2017-10-18 NOTE — Anesthesia Procedure Notes (Signed)
Procedure Name: LMA Insertion Date/Time: 10/18/2017 12:34 PM Performed by: Briant Sitesenenny, Alta Goding T, CRNA Pre-anesthesia Checklist: Patient identified, Emergency Drugs available, Suction available and Patient being monitored Patient Re-evaluated:Patient Re-evaluated prior to induction Oxygen Delivery Method: Circle system utilized Preoxygenation: Pre-oxygenation with 100% oxygen Induction Type: IV induction Ventilation: Mask ventilation without difficulty LMA: LMA inserted LMA Size: 4.0 Number of attempts: 1 Airway Equipment and Method: Bite block Placement Confirmation: positive ETCO2 Tube secured with: Tape Dental Injury: Teeth and Oropharynx as per pre-operative assessment

## 2017-10-18 NOTE — Brief Op Note (Signed)
10/18/2017  1:34 PM  PATIENT:  Yolanda Morrison  32 y.o. female  PRE-OPERATIVE DIAGNOSIS:  Left lateral meniscus tear with cyst  POST-OPERATIVE DIAGNOSIS:  Left lateral meniscus tear with cyst  PROCEDURE:  Procedure(s) with comments: Left knee arthroscopy partial lateral menisectomy (Left) - 60 mins  SURGEON:  Surgeon(s) and Role:    * Yolonda Kidaogers, Jason Patrick, MD - Primary  PHYSICIAN ASSISTANT:   ASSISTANTS: none   ANESTHESIA:   general  EBL:  5 mL   BLOOD ADMINISTERED:none  DRAINS: none   LOCAL MEDICATIONS USED:  MARCAINE     SPECIMEN:  No Specimen  DISPOSITION OF SPECIMEN:  N/A  COUNTS:  YES  TOURNIQUET:   Total Tourniquet Time Documented: Thigh (Left) - 13 minutes Total: Thigh (Left) - 13 minutes   DICTATION: .Note written in EPIC  PLAN OF CARE: Discharge to home after PACU  PATIENT DISPOSITION:  PACU - hemodynamically stable.   Delay start of Pharmacological VTE agent (>24hrs) due to surgical blood loss or risk of bleeding: not applicable

## 2017-10-18 NOTE — Discharge Instructions (Signed)
Discharge instructions: -Okay for weightbearing as tolerated to the left lower extremity.  Use crutches however for the first 1-2 weeks as needed. -Maintain your postoperative bandages for 3 days.  On the fourth day you may remove the bandages and begin showering at that time. -For the prevention of blood clots maintain your Ted compression hose for 2 weeks.  Take a baby aspirin twice daily for 6 weeks. -Elevate the extremity and apply ice to the left knee for 20-30 minutes out of each hour. -You are okay to straighten and flex the knee as tolerated.  This is encouraged. -For mild to moderate pain take Tylenol and/or ibuprofen as needed.  Take Norco as directed for breakthrough pain. -Return to see Dr. Aundria Rudogers in 2 weeks for wound check and suture removal.  NO ADVIL, ALEVE, MOTRIN, IBUPROFEN UNTIL 6:30 PM TODAY   Post Anesthesia Home Care Instructions  Activity: Get plenty of rest for the remainder of the day. A responsible individual must stay with you for 24 hours following the procedure.  For the next 24 hours, DO NOT: -Drive a car -Advertising copywriterperate machinery -Drink alcoholic beverages -Take any medication unless instructed by your physician -Make any legal decisions or sign important papers.  Meals: Start with liquid foods such as gelatin or soup. Progress to regular foods as tolerated. Avoid greasy, spicy, heavy foods. If nausea and/or vomiting occur, drink only clear liquids until the nausea and/or vomiting subsides. Call your physician if vomiting continues.  Special Instructions/Symptoms: Your throat may feel dry or sore from the anesthesia or the breathing tube placed in your throat during surgery. If this causes discomfort, gargle with warm salt water. The discomfort should disappear within 24 hours.  If you had a scopolamine patch placed behind your ear for the management of post- operative nausea and/or vomiting:  1. The medication in the patch is effective for 72 hours, after which  it should be removed.  Wrap patch in a tissue and discard in the trash. Wash hands thoroughly with soap and water. 2. You may remove the patch earlier than 72 hours if you experience unpleasant side effects which may include dry mouth, dizziness or visual disturbances. 3. Avoid touching the patch. Wash your hands with soap and water after contact with the patch.

## 2017-10-21 ENCOUNTER — Encounter (HOSPITAL_BASED_OUTPATIENT_CLINIC_OR_DEPARTMENT_OTHER): Payer: Self-pay | Admitting: Orthopedic Surgery

## 2019-03-24 IMAGING — MR MR KNEE*L* W/O CM
4 of 5 series · 19 of 40 positions shown · non-contrast
Comparison: None.

CLINICAL DATA: Medial left knee pain and popping for 3 months. No
known injury.

EXAM:
MRI OF THE LEFT KNEE WITHOUT CONTRAST
TECHNIQUE: Multiplanar, multisequence MR imaging of the knee was performed. No
intravenous contrast was administered.

[Series 3: PD fat-sat · axial · 3.5mm · 0.31mm/px · z∈[-49,+56]mm · 8 of 26 slices shown (1 of 3)]
[im 1/26]
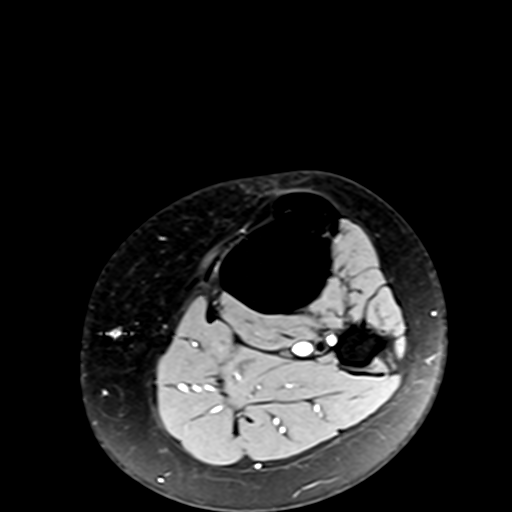
[im 4/26]
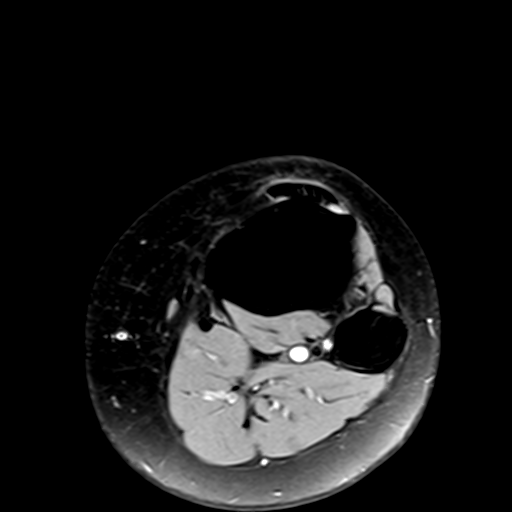
[im 8/26]
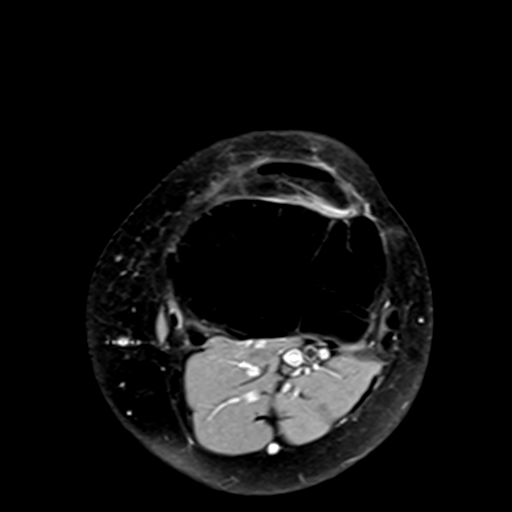
[im 11/26]
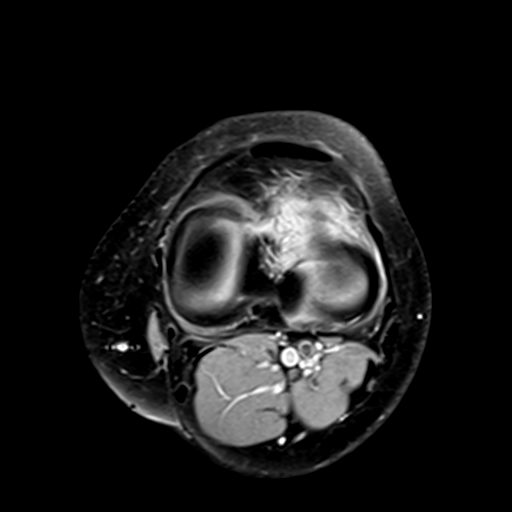
[im 15/26]
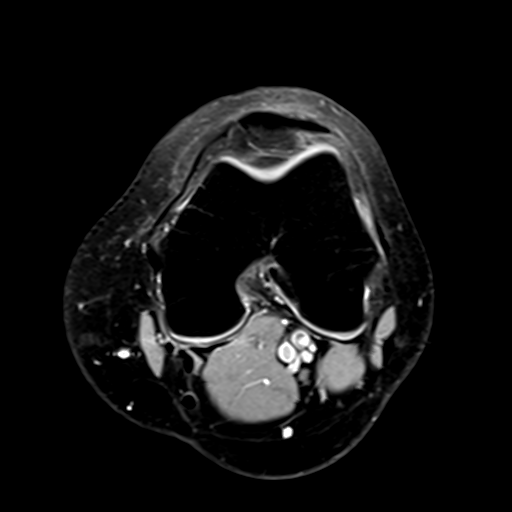
[im 18/26]
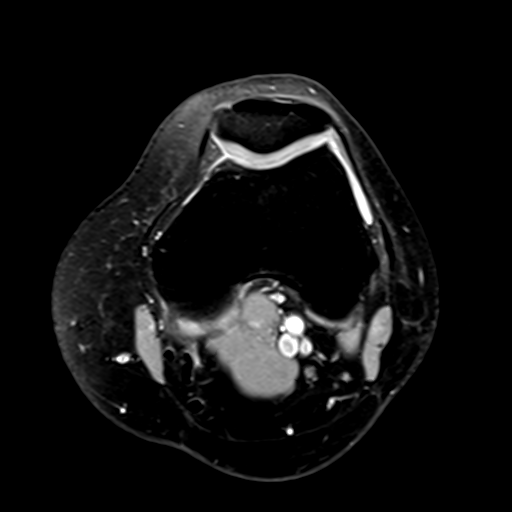
[im 22/26]
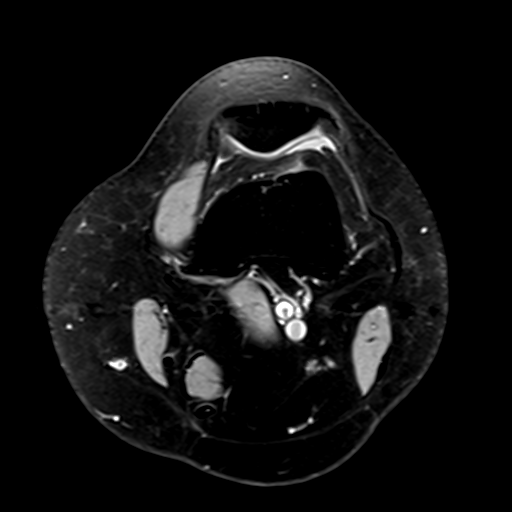
[im 26/26]
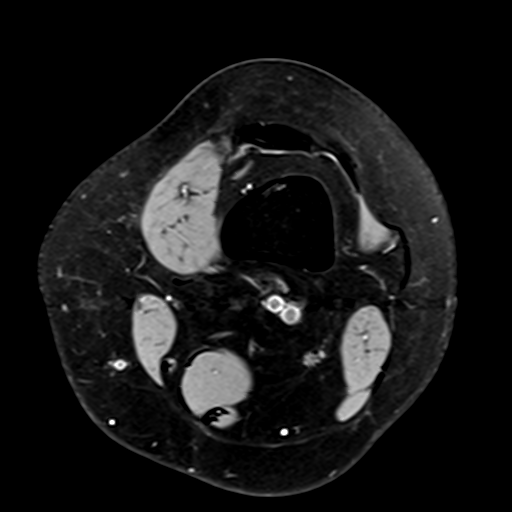

[Series 4: PD fat-sat · coronal · 3.5mm · 0.29mm/px · 5 of 22 slices shown (2 of 3)]
[im 1/22]
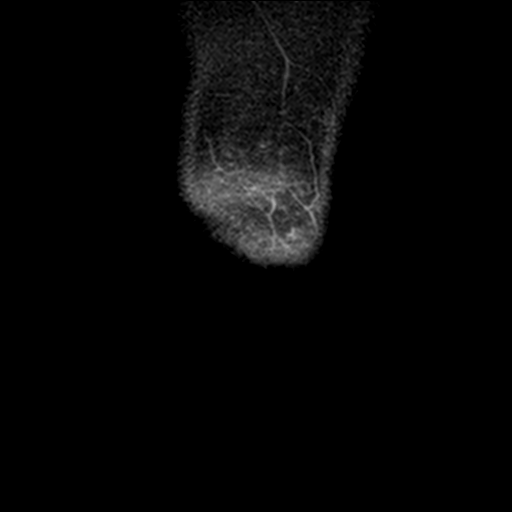
[im 4/22]
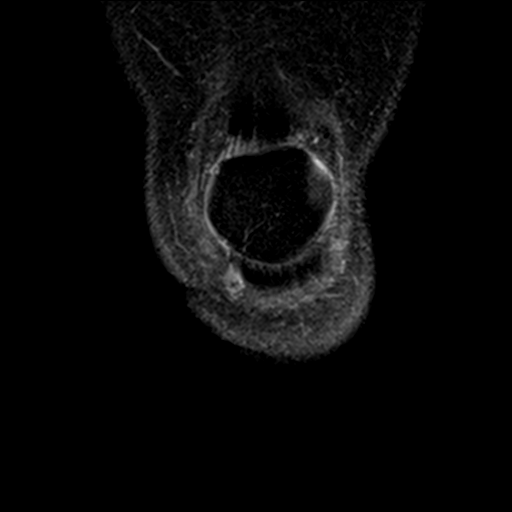
[im 7/22]
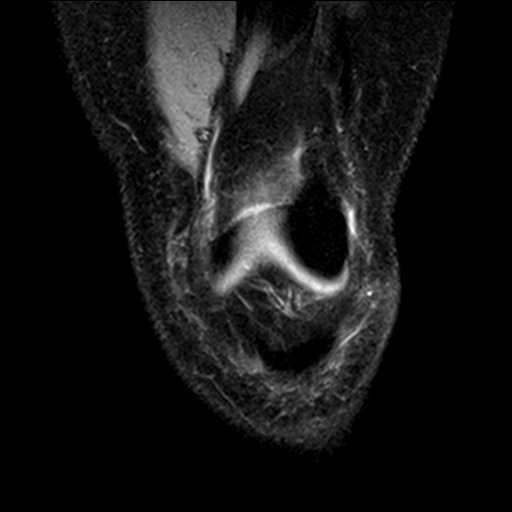
[im 13/22]
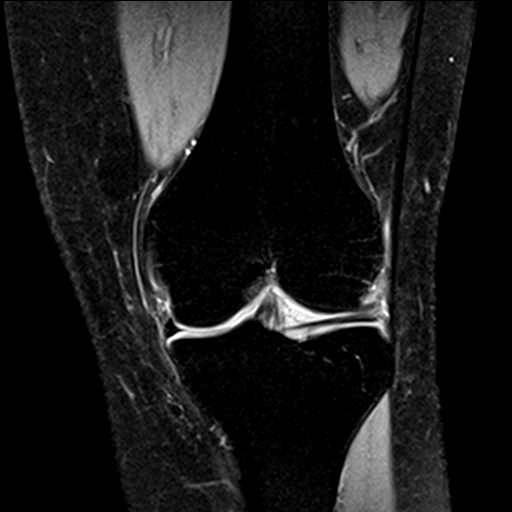
[im 19/22]
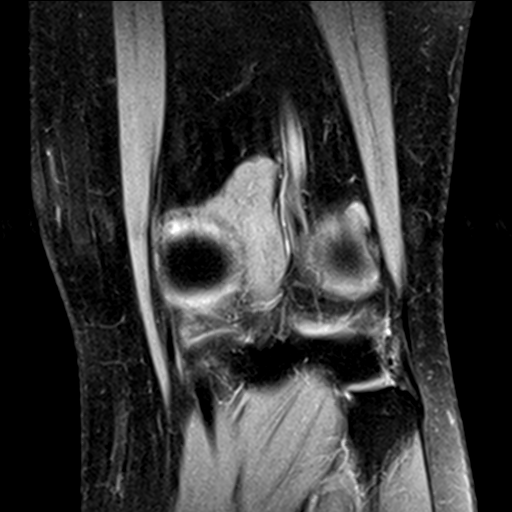

[Series 5: PD fat-sat · sagittal · 3.2mm · 0.29mm/px · 3 of 24 slices shown (3 of 3)]
[im 4/24]
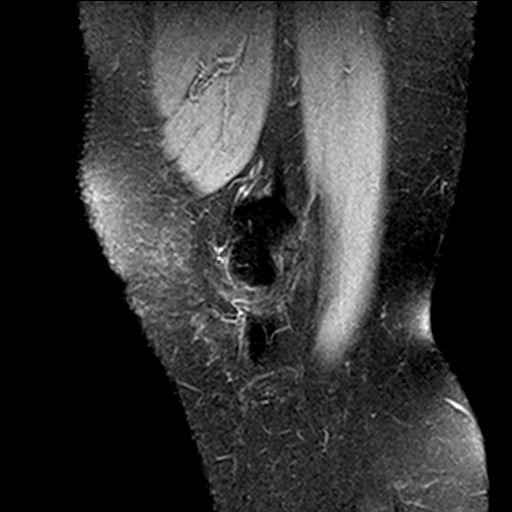
[im 14/24]
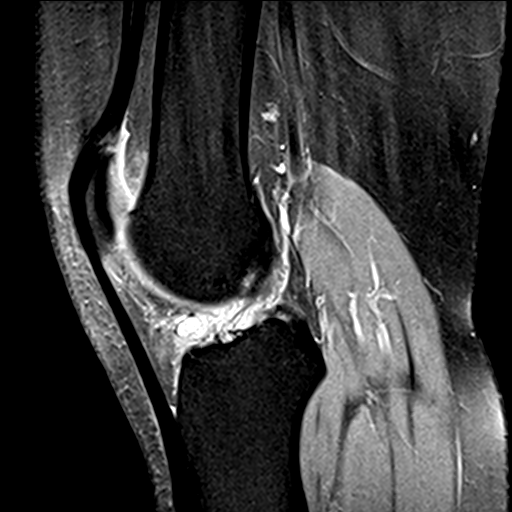
[im 20/24]
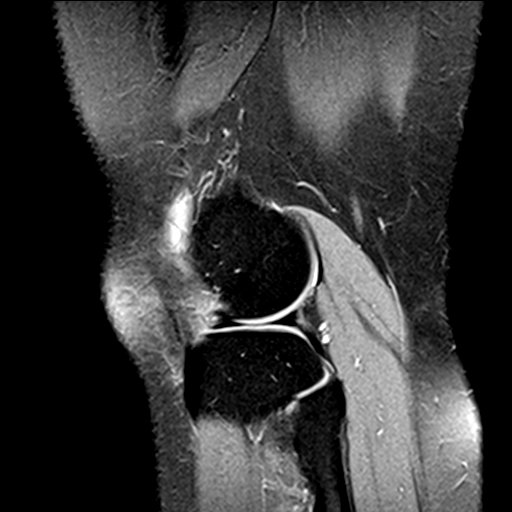

[Series 6: T2 fat-sat · coronal · 3.5mm · 0.29mm/px · 3 of 22 slices shown]
[im 4/22]
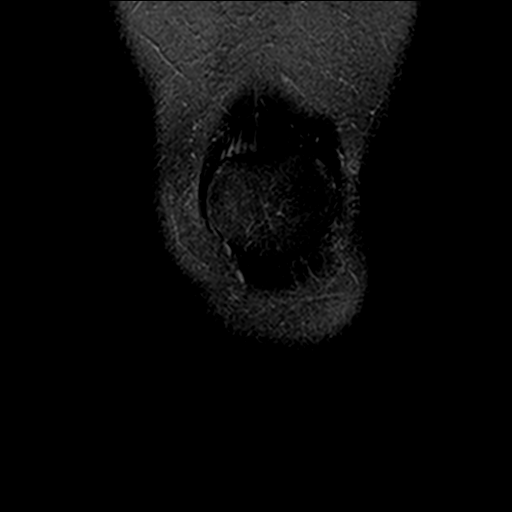
[im 13/22]
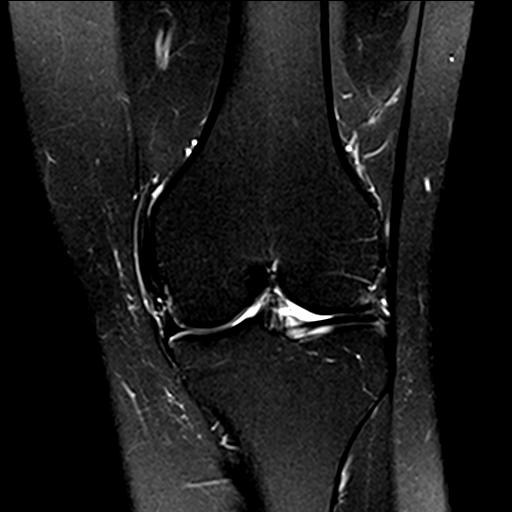
[im 19/22]
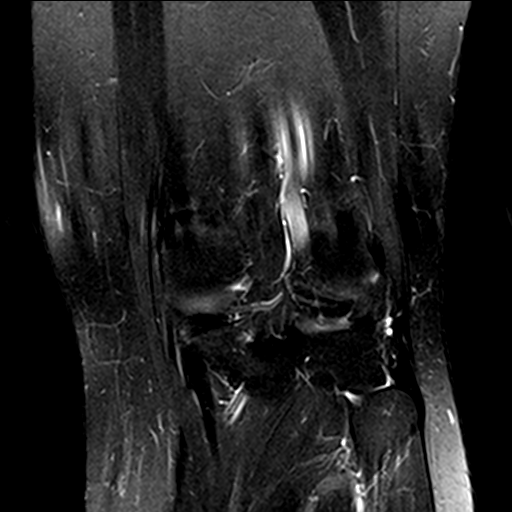

[19 of 40 positions shown; findings below may reference images not displayed]

FINDINGS: MENISCI

Medial meniscus:  Intact.

Lateral meniscus: There is a large tear of the capsular surface of
the anterior horn with an associated a septated parameniscal cyst
measuring 1.9 cm transverse by 0.8 cm AP by 1.1 cm craniocaudal. No
tear of the lateral meniscus reaching an articular surface is
identified.

LIGAMENTS

Cruciates:  Intact.

Collaterals:  Intact.

CARTILAGE

Patellofemoral:  Normal.

Medial:  Normal.

Lateral:  Normal.

Joint:  No effusion.

Popliteal Fossa:  No Baker's cyst.

Extensor Mechanism:  Intact.

Bones:  Normal marrow signal throughout.

Other: None.
IMPRESSION: Large tear of the capsular surface of the anterior horn of the
lateral meniscus with an associated parameniscal cyst as described
above. No meniscal tear reaching an articular surface is identified.

All ligamentous structures and the medial meniscus are intact.

## 2019-04-24 ENCOUNTER — Other Ambulatory Visit: Payer: Self-pay | Admitting: Physician Assistant

## 2019-04-24 DIAGNOSIS — E237 Disorder of pituitary gland, unspecified: Secondary | ICD-10-CM

## 2019-05-07 ENCOUNTER — Inpatient Hospital Stay: Admission: RE | Admit: 2019-05-07 | Payer: 59 | Source: Ambulatory Visit

## 2019-05-26 ENCOUNTER — Other Ambulatory Visit: Payer: 59

## 2024-02-28 ENCOUNTER — Ambulatory Visit
Admission: EM | Admit: 2024-02-28 | Discharge: 2024-02-28 | Disposition: A | Discharge status: Home / Self Care | Attending: Family Medicine | Admitting: Family Medicine

## 2024-02-28 ENCOUNTER — Other Ambulatory Visit: Payer: Self-pay

## 2024-02-28 DIAGNOSIS — B349 Viral infection, unspecified: Secondary | ICD-10-CM | POA: Diagnosis present

## 2024-02-28 DIAGNOSIS — J029 Acute pharyngitis, unspecified: Secondary | ICD-10-CM | POA: Insufficient documentation

## 2024-02-28 LAB — POCT RAPID STREP A (OFFICE): Rapid Strep A Screen: NEGATIVE

## 2024-02-28 MED ORDER — BENZONATATE 200 MG PO CAPS
200.0000 mg | ORAL_CAPSULE | Freq: Three times a day (TID) | ORAL | 0 refills | Status: AC | PRN
Start: 2024-02-28 — End: ?

## 2024-02-28 NOTE — Discharge Instructions (Addendum)
 Your strep test was negative.  The clinic will send this for a culture and contact you if those results are positive.  Please treat your symptoms with over the counter tylenol  or ibuprofen, humidifier, and rest.  You may take Tessalon  3 times a day as needed for your cough.  Viral illnesses can last 7-14 days. Please follow up with your PCP if your symptoms are not improving. Please go to the ER for any worsening symptoms. This includes but is not limited to fever you can not control with tylenol  or ibuprofen, you are not able to stay hydrated, you have shortness of breath or chest pain.  Thank you for choosing [facility] for your healthcare needs. I hope you feel better soon!

## 2024-02-28 NOTE — ED Triage Notes (Signed)
 Pt c/o nasal congestion, sore throat, productive cough w/green/yellow mucous, low grade fever and chillsx3d. Pt states has been taking mucinex  and ibuprofen and it has been helping. PT states she woke up this morning and uvula was swollen and dry.

## 2024-02-28 NOTE — ED Provider Notes (Signed)
 [facility]    CSN: [id number] Arrival date & time: 02/28/24  1639      History   Chief Complaint No chief complaint on file.   HPI Yolanda Morrison is a 38 y.o. female  presents for evaluation of URI symptoms for 3 days. Patient reports associated symptoms of cough, congestion, sore throat, low-grade fevers, chills. Denies N/V/D, ear pain, body aches, shortness of breath.  This morning she felt like her uvula was swollen.  Patient does not have a hx of asthma. Patient is an active smoker.   Reports daughter has similar symptoms.  Pt has taken Mucinex  and ibuprofen OTC for symptoms. Pt has no other concerns at this time.   HPI  Past Medical History:  Diagnosis Date   Cold    sore throat    Patient Active Problem List   Diagnosis Date Noted   Acute lateral meniscus tear of left knee 10/18/2017    Past Surgical History:  Procedure Laterality Date   KNEE ARTHROSCOPY WITH LATERAL MENISECTOMY Left 10/18/2017   Procedure: Left knee arthroscopy partial lateral menisectomy;  Surgeon: Sharl Selinda Dover, MD;  Location: [facility];  Service: Orthopedics;  Laterality: Left;  60 mins   NO PAST SURGERIES      OB History   No obstetric history on file.      Home Medications    Prior to Admission medications   Medication Sig Start Date End Date Taking? Authorizing Provider  benzonatate  (TESSALON ) 200 MG capsule Take 1 capsule (200 mg total) by mouth 3 (three) times daily as needed. 02/28/24  Yes Wilder Amodei, Jodi R, NP  HYDROcodone -acetaminophen  (NORCO) 5-325 MG tablet Take 1-2 tablets by mouth every 6 (six) hours as needed for moderate pain or severe pain. 10/18/17   Sharl Selinda Dover, MD  ondansetron  (ZOFRAN  ODT) 4 MG disintegrating tablet Take 1 tablet (4 mg total) by mouth every 8 (eight) hours as needed for nausea or vomiting. 10/18/17   Sharl Selinda Dover, MD    Family History Family History  Problem Relation Age of Onset   Hypertension  Father    Hyperlipidemia Father    Cancer Maternal Grandmother     Social History Social History   Tobacco Use   Smoking status: Every Day    Types: Cigars   Smokeless tobacco: Never  Vaping Use   Vaping status: Never Used  Substance Use Topics   Alcohol use: No    Alcohol/week: 0.0 standard drinks of alcohol   Drug use: No     Allergies   Patient has no known allergies.   Review of Systems Review of Systems  Constitutional:  Positive for chills and fever.  HENT:  Positive for congestion and sore throat.   Respiratory:  Positive for cough.      Physical Exam Triage Vital Signs ED Triage Vitals  Encounter Vitals Group     BP 02/28/24 1654 118/81     Girls Systolic BP Percentile --      Girls Diastolic BP Percentile --      Boys Systolic BP Percentile --      Boys Diastolic BP Percentile --      Pulse Rate 02/28/24 1654 84     Resp 02/28/24 1654 16     Temp 02/28/24 1654 (!) 97.5 F (36.4 C)     Temp Source 02/28/24 1654 Oral     SpO2 02/28/24 1654 95 %     Weight --  Height --      Head Circumference --      Peak Flow --      Pain Score 02/28/24 1651 3     Pain Loc --      Pain Education --      Exclude from Growth Chart --    No data found.  Updated Vital Signs BP 118/81   Pulse 84   Temp (!) 97.5 F (36.4 C) (Oral)   Resp 16   LMP 02/09/2024   SpO2 95%   Visual Acuity Right Eye Distance:   Left Eye Distance:   Bilateral Distance:    Right Eye Near:   Left Eye Near:    Bilateral Near:     Physical Exam Vitals and nursing note reviewed.  Constitutional:      General: She is not in acute distress.    Appearance: She is well-developed. She is not ill-appearing.  HENT:     Head: Normocephalic and atraumatic.     Right Ear: Tympanic membrane and ear canal normal.     Left Ear: Tympanic membrane and ear canal normal.     Nose: Congestion present.     Mouth/Throat:     Mouth: Mucous membranes are moist.     Pharynx: Oropharynx is  clear. Uvula midline. Posterior oropharyngeal erythema present. No pharyngeal swelling, oropharyngeal exudate or uvula swelling.     Tonsils: No tonsillar exudate or tonsillar abscesses.  Eyes:     Conjunctiva/sclera: Conjunctivae normal.     Pupils: Pupils are equal, round, and reactive to light.  Cardiovascular:     Rate and Rhythm: Normal rate and regular rhythm.     Heart sounds: Normal heart sounds.  Pulmonary:     Effort: Pulmonary effort is normal.     Breath sounds: Normal breath sounds. No wheezing or rhonchi.  Musculoskeletal:     Cervical back: Normal range of motion and neck supple.  Lymphadenopathy:     Cervical: No cervical adenopathy.  Skin:    General: Skin is warm and dry.  Neurological:     General: No focal deficit present.     Mental Status: She is alert and oriented to person, place, and time.  Psychiatric:        Mood and Affect: Mood normal.        Behavior: Behavior normal.      UC Treatments / Results  Labs (all labs ordered are listed, but only abnormal results are displayed) Labs Reviewed  CULTURE, GROUP A STREP [facility])  POCT RAPID STREP A (OFFICE)    EKG   Radiology No results found.  Procedures Procedures (including critical care time)  Medications Ordered in UC Medications - No data to display  Initial Impression / Assessment and Plan / UC Course  I have reviewed the triage vital signs and the nursing notes.  Pertinent labs & imaging results that were available during my care of the patient were reviewed by me and considered in my medical decision making (see chart for details).     Reviewed exam and symptoms with patient.  No red flags.  Negative rapid strep, will culture.  Patient declined COVID testing.  Discussed viral illness and symptomatic treatment.  Tessalon  as needed for cough.  Encouraged rest fluids and PCP follow-up if symptoms do not improve.  ER precautions reviewed. Final Clinical Impressions(s) / UC Diagnoses    Final diagnoses:  Sore throat  Viral illness     Discharge Instructions  Your strep test was negative.  The clinic will send this for a culture and contact you if those results are positive.  Please treat your symptoms with over the counter tylenol  or ibuprofen, humidifier, and rest.  You may take Tessalon  3 times a day as needed for your cough.  Viral illnesses can last 7-14 days. Please follow up with your PCP if your symptoms are not improving. Please go to the ER for any worsening symptoms. This includes but is not limited to fever you can not control with tylenol  or ibuprofen, you are not able to stay hydrated, you have shortness of breath or chest pain.  Thank you for choosing [facility] for your healthcare needs. I hope you feel better soon!      ED Prescriptions     Medication Sig Dispense Auth. Provider   benzonatate  (TESSALON ) 200 MG capsule Take 1 capsule (200 mg total) by mouth 3 (three) times daily as needed. 20 capsule Shanece Cochrane, Jodi R, NP      PDMP not reviewed this encounter.   Loreda Myla SAUNDERS, NP 02/28/24 [phone]

## 2024-03-02 ENCOUNTER — Ambulatory Visit (HOSPITAL_COMMUNITY): Payer: Self-pay

## 2024-03-02 LAB — CULTURE, GROUP A STREP (THRC)
